# Patient Record
Sex: Male | Born: 1990 | Race: White | Hispanic: No | Marital: Married | Smoking: Never smoker
Health system: Southern US, Community
[De-identification: ages and names within clinical notes are randomized; demographics above are authoritative.]

## PROBLEM LIST (undated history)

## (undated) HISTORY — PX: CLAVICLE SURGERY: SHX598

---

## 2016-08-14 ENCOUNTER — Emergency Department (HOSPITAL_BASED_OUTPATIENT_CLINIC_OR_DEPARTMENT_OTHER): Payer: BLUE CROSS/BLUE SHIELD

## 2016-08-14 ENCOUNTER — Emergency Department (HOSPITAL_BASED_OUTPATIENT_CLINIC_OR_DEPARTMENT_OTHER)
Admission: EM | Admit: 2016-08-14 | Discharge: 2016-08-14 | Disposition: A | Discharge status: Home / Self Care | Payer: BLUE CROSS/BLUE SHIELD | Attending: Emergency Medicine | Admitting: Emergency Medicine

## 2016-08-14 ENCOUNTER — Encounter (HOSPITAL_BASED_OUTPATIENT_CLINIC_OR_DEPARTMENT_OTHER): Payer: Self-pay

## 2016-08-14 DIAGNOSIS — Y999 Unspecified external cause status: Secondary | ICD-10-CM | POA: Insufficient documentation

## 2016-08-14 DIAGNOSIS — Y929 Unspecified place or not applicable: Secondary | ICD-10-CM | POA: Insufficient documentation

## 2016-08-14 DIAGNOSIS — M25462 Effusion, left knee: Secondary | ICD-10-CM

## 2016-08-14 DIAGNOSIS — S83512A Sprain of anterior cruciate ligament of left knee, initial encounter: Secondary | ICD-10-CM

## 2016-08-14 DIAGNOSIS — T148XXA Other injury of unspecified body region, initial encounter: Secondary | ICD-10-CM

## 2016-08-14 DIAGNOSIS — W2107XA Struck by softball, initial encounter: Secondary | ICD-10-CM | POA: Diagnosis not present

## 2016-08-14 DIAGNOSIS — Y9364 Activity, baseball: Secondary | ICD-10-CM | POA: Diagnosis not present

## 2016-08-14 DIAGNOSIS — S82202A Unspecified fracture of shaft of left tibia, initial encounter for closed fracture: Secondary | ICD-10-CM | POA: Insufficient documentation

## 2016-08-14 DIAGNOSIS — S8992XA Unspecified injury of left lower leg, initial encounter: Secondary | ICD-10-CM | POA: Diagnosis present

## 2016-08-14 MED ORDER — IBUPROFEN 800 MG PO TABS: 800.0000 mg | ORAL_TABLET | Freq: Four times a day (QID) | ORAL | 0 refills | Status: AC

## 2016-08-14 NOTE — ED Triage Notes (Signed)
c/o left knee injury while running approx 1 hour PTA-presents to triage in w/c

## 2016-08-14 NOTE — ED Provider Notes (Signed)
[facility] Provider Note   CSN: [id number] Arrival date & time: 08/14/16  2052  By signing my name below, I, Nelwyn Salisbury, attest that this documentation has been prepared under the direction and in the presence of Lyndal Pulley, MD. Electronically Signed: Nelwyn Salisbury, Scribe. 08/14/2016. 10:50 PM.  History   Chief Complaint Chief Complaint  Patient presents with  . Knee Injury   The history is provided by the patient. No language interpreter was used.   HPI Comments:  Eric Leon is an otherwise healthy 26 y.o. male who presents to the Emergency Department complaining of sudden-onset, constant, moderate left knee pain onset today. Pt was playing softball when he slid into a base and felt sudden pain. He reports associated weakness to the area. Pt has not tried any medications PTA. Denies any numbness.   History reviewed. No pertinent past medical history.  There are no active problems to display for this patient.   Past Surgical History:  Procedure Laterality Date  . CLAVICLE SURGERY      Home Medications    Prior to Admission medications   Not on File    Family History No family history on file.  Social History Social History  Substance Use Topics  . Smoking status: Never Smoker  . Smokeless tobacco: Never Used  . Alcohol use No     Allergies   Penicillins   Review of Systems Review of Systems  Musculoskeletal: Positive for arthralgias.  Neurological: Positive for weakness. Negative for numbness.  All other systems reviewed and are negative.    Physical Exam Updated Vital Signs BP 131/81 (BP Location: Left Arm)   Pulse 84   Temp 98.2 F (36.8 C) (Oral)   Resp 20   Ht  (1.88 m)   Wt 190 lb (86.2 kg)   SpO2 99%   BMI 24.39 kg/m   Physical Exam  Constitutional: He is oriented to person, place, and time. He appears well-developed and well-nourished. No distress.  HENT:  Head: Normocephalic and atraumatic.  Eyes: Conjunctivae  are normal.  Cardiovascular: Normal rate.   Pulmonary/Chest: Effort normal.  Abdominal: He exhibits no distension.  Musculoskeletal:       Left knee: He exhibits effusion (small). He exhibits normal range of motion. No tenderness found.  Negative anterior/posterior drawer. Negative Lachman.   Neurological: He is alert and oriented to person, place, and time.  Skin: Skin is warm and dry.  Psychiatric: He has a normal mood and affect.  Nursing note and vitals reviewed.  ED Treatments / Results  DIAGNOSTIC STUDIES:  Oxygen Saturation is 99% on RA, normal by my interpretation.    COORDINATION OF CARE:  10:58 PM Discussed treatment plan with pt at bedside which includes rest ice, compression and elevation. Pt will be referred to orthopedics for follow-up. Pt agreed to plan.  Labs (all labs ordered are listed, but only abnormal results are displayed) Labs Reviewed - No data to display  EKG  EKG Interpretation None       Radiology Dg Knee Complete 4 Views Left  Result Date: 08/14/2016 CLINICAL DATA:  Pain following fall EXAM: LEFT KNEE - COMPLETE 4+ VIEW COMPARISON:  None. FINDINGS: Frontal, lateral, and bilateral oblique views were obtained. There are avulsion fractures along the anterior tibial surface, appreciable only on the lateral view. No other fractures are evident. No dislocation. There is a sizable joint effusion. There is no appreciable joint space narrowing. No erosive change peer IMPRESSION: Small avulsion fractures along the  anterior tibial surface seen only on the lateral view. Sizable joint effusion. No other fractures. No dislocations. No appreciable arthropathic change. Electronically Signed   By: Bretta Bang III M.D.   On: 08/14/2016 21:45    Procedures Procedures (including critical care time)  Medications Ordered in ED Medications - No data to display   Initial Impression / Assessment and Plan / ED Course  I have reviewed the triage vital signs and the  nursing notes.  Pertinent labs & imaging results that were available during my care of the patient were reviewed by me and considered in my medical decision making (see chart for details).     26 y.o. male presents with immediate knee pain after playing softball. He has a small joint effusion and small avulsion fracture at ACL insertion suspicious for ACL sprain and partial tear. Stable to AP confrontation. Knee immobilizer, crutches, and f/u with orthopedics for evaluation and PT versus MR for definitive imaging.   Final Clinical Impressions(s) / ED Diagnoses   Final diagnoses:  Sprain of anterior cruciate ligament of left knee, initial encounter  Effusion of left knee  Avulsion fracture    New Prescriptions Discharge Medication List as of 08/14/2016 11:10 PM    START taking these medications   Details  ibuprofen (ADVIL,MOTRIN) 800 MG tablet Take 1 tablet (800 mg total) by mouth every 6 (six) hours., Starting Tue 08/14/2016, Until Sun 08/19/2016, Print      I personally performed the services described in this documentation, which was scribed in my presence. The recorded information has been reviewed and is accurate.       Lyndal Pulley, MD 08/15/16 [phone]

## 2016-08-17 ENCOUNTER — Other Ambulatory Visit (HOSPITAL_COMMUNITY): Payer: Self-pay | Admitting: Orthopedic Surgery

## 2016-08-17 ENCOUNTER — Other Ambulatory Visit: Payer: Self-pay | Admitting: Orthopedic Surgery

## 2016-08-17 ENCOUNTER — Ambulatory Visit (HOSPITAL_COMMUNITY)
Admission: RE | Admit: 2016-08-17 | Discharge: 2016-08-17 | Disposition: A | Discharge status: Home / Self Care | Payer: BLUE CROSS/BLUE SHIELD | Source: Ambulatory Visit | Attending: Cardiology | Admitting: Cardiology

## 2016-08-17 DIAGNOSIS — M79662 Pain in left lower leg: Secondary | ICD-10-CM | POA: Diagnosis not present

## 2016-08-17 DIAGNOSIS — M79605 Pain in left leg: Secondary | ICD-10-CM | POA: Diagnosis not present

## 2016-08-17 DIAGNOSIS — M7989 Other specified soft tissue disorders: Secondary | ICD-10-CM | POA: Diagnosis not present

## 2016-08-17 DIAGNOSIS — M2392 Unspecified internal derangement of left knee: Secondary | ICD-10-CM

## 2016-08-18 ENCOUNTER — Ambulatory Visit
Admission: RE | Admit: 2016-08-18 | Discharge: 2016-08-18 | Disposition: A | Discharge status: Home / Self Care | Payer: BLUE CROSS/BLUE SHIELD | Source: Ambulatory Visit | Attending: Orthopedic Surgery | Admitting: Orthopedic Surgery

## 2016-08-18 DIAGNOSIS — M2392 Unspecified internal derangement of left knee: Secondary | ICD-10-CM

## 2016-09-05 ENCOUNTER — Ambulatory Visit: Payer: BLUE CROSS/BLUE SHIELD | Attending: Orthopedic Surgery | Admitting: Physical Therapy

## 2016-09-05 DIAGNOSIS — R2689 Other abnormalities of gait and mobility: Secondary | ICD-10-CM

## 2016-09-05 DIAGNOSIS — M25562 Pain in left knee: Secondary | ICD-10-CM

## 2016-09-05 DIAGNOSIS — M25662 Stiffness of left knee, not elsewhere classified: Secondary | ICD-10-CM

## 2016-09-05 DIAGNOSIS — M6281 Muscle weakness (generalized): Secondary | ICD-10-CM | POA: Diagnosis present

## 2016-09-05 DIAGNOSIS — R262 Difficulty in walking, not elsewhere classified: Secondary | ICD-10-CM | POA: Diagnosis present

## 2016-09-05 NOTE — Therapy (Signed)
[facility] [address]  [address] [place], [place], [address] Phone: [phone]   Fax:  [phone]  Physical Therapy Evaluation  Patient Details  Name: Eric Leon MRN: [id number] Date of Birth: 31-Jan-1991 Referring Provider: Dr. Duwayne Leon  Encounter Date: 09/05/2016      PT End of Session - 09/05/16 0906    Visit Number 1   Number of Visits 12   Date for PT Re-Evaluation 10/17/16   PT Start Time 0840   PT Stop Time 0916   PT Time Calculation (min) 36 min   Activity Tolerance Patient tolerated treatment well   Behavior During Therapy [facility] for tasks assessed/performed      No past medical history on file.  Past Surgical History:  Procedure Laterality Date  . CLAVICLE SURGERY      There were no vitals filed for this visit.       Subjective Assessment - 09/05/16 0842    Subjective Pateint reports he was playing softball and hyperextended knee - tear of L medial meniscus. Dr. Aundria Rud put patient in brace as well as bilateral axillary crutches. Reports he can be WBAT - does not know if surgery is in the future or not. Only pain with bending and flexing of knee.    Limitations Standing;Walking   Diagnostic tests MRI - tear of L medial meniscus, torn PCL   Patient Stated Goals to not have surgery   Currently in Pain? No/denies            [facility] PT Assessment - 09/05/16 0844      Assessment   Medical Diagnosis tear of L medial meniscus, torn L PCL   Referring Provider Dr. Duwayne Leon   Onset Date/Surgical Date 08/14/16   Next MD Visit 09/24/16   Prior Therapy no     Precautions   Precautions None     Restrictions   Weight Bearing Restrictions Yes   LLE Weight Bearing Weight bearing as tolerated   Other Position/Activity Restrictions bilateral axillary crutches     Balance Screen   Has the patient fallen in the past 6 months No   Has the patient had a decrease in activity level because of a fear of  falling?  No   Is the patient reluctant to leave their home because of a fear of falling?  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   Leisure softball, basketball     Cognition   Overall Cognitive Status Within Functional Limits for tasks assessed     Observation/Other Assessments   Focus on Therapeutic Outcomes (FOTO)  Knee: 28 (72% limited, predicted 39% limited)     Sensation   Light Touch Appears Intact     Coordination   Gross Motor Movements are Fluid and Coordinated Yes     ROM / Strength   AROM / PROM / Strength AROM;PROM;Strength     AROM   AROM Assessment Site Knee   Right/Left Knee Right;Left   Right Knee Extension -3   Right Knee Flexion 138  Left Knee Extension 10   Left Knee Flexion 106     Strength   Strength Assessment Site Hip;Knee   Right/Left Hip Right;Left   Right Hip Flexion 4+/5   Left Hip Flexion 3+/5   Right/Left Knee Right;Left   Right Knee Flexion 5/5   Right Knee Extension 5/5   Left Knee Flexion 4-/5   Left Knee Extension 4-/5     Ambulation/Gait   Ambulation/Gait Yes   Ambulation/Gait Assistance 6: Modified independent (Device/Increase time);5: Supervision   Ambulation Distance (Feet) 200 Feet   Assistive device R Axillary Crutch;L Axillary Crutch   Gait Pattern Step-to pattern   Ambulation Surface Level;Indoor   Gait Comments education on step through pattern with beginning to place weight through L LE - a this time a tendency to place L toes on ground only - patient reporting "tight" calf mm.                    [facility] Adult PT Treatment/Exercise - 09/05/16  0844      Exercises   Exercises Knee/Hip     Knee/Hip Exercises: Stretches   Passive Hamstring Stretch Left;3 reps;30 seconds   Passive Hamstring Stretch Limitations supine with strap   Gastroc Stretch Left;3 reps;30 seconds   Gastroc Stretch Limitations long-sitting with strap     Knee/Hip Exercises: Supine   Quad Sets Strengthening;Left;15 reps   Quad Sets Limitations with foot propped   Straight Leg Raises Strengthening;Left;15 reps                PT Education - 09/05/16 0906    Education provided Yes   Education Details exam findings, POC, HEP   Person(s) Educated Patient   Methods Explanation;Demonstration;Handout   Comprehension Verbalized understanding;Returned demonstration          PT Short Term Goals - 09/05/16 1055      PT SHORT TERM GOAL #1   Title Patient to be independent with initial HEP for gentle stretching and strengthening (09/19/16)   Status New           PT Long Term Goals - 09/05/16 1154      PT LONG TERM GOAL #1   Title Patient to be independent with advanced HEP (10/17/16)   Status New     PT LONG TERM GOAL #2   Title Patient to demonstrate proper gait mechanics with good heel toe gait pattern (10/17/16)   Status New     PT LONG TERM GOAL #3   Title Patient to improve L knee AROM equal to that of R knee with no pain (10/17/16)   Status New     PT LONG TERM GOAL #4   Title Patient to improve L LE gross strength to >/=4+/5 (10/17/16)   Status New     PT LONG TERM GOAL #5   Title Patient to demonstrate reciprocal gait pattern up/down 1 flight of stairs with good quad control (10/17/16)   Status New               Plan - 09/05/16 0909    Clinical Impression Statement Eric Leon is a 26 y/o male presenting to OPPT today for initial evaluation following L medial meniscus and L PCL tear along with MCL sprain and MCL and pes anserine traumatic bursitis due to hyperextension injury sustained during softball. Patient presenting to PT today  with bilateral axillary crutches as well as brace to prevent buckling of L knee during gait. Patient ambulating with step to pattern  with no weight bearing through LE - education today on step through pattern with beginning to place weight through toes on L LE. AROM as well as strength limited with knee extension being current main limiting factor - however improved with quad sets and static stretching. Patient to benefit from PT to address L knee ROM and strength, as well as to promote return to normal gait mechanics.    Rehab Potential Good   PT Frequency 2x / week   PT Duration 6 weeks   PT Treatment/Interventions ADLs/Self Care Home Management;Cryotherapy;Electrical Stimulation;Moist Heat;Ultrasound;Neuromuscular re-education;Balance training;Therapeutic exercise;Therapeutic activities;Functional mobility training;Stair training;Gait training;Patient/family education;Manual techniques;Passive range of motion;Vasopneumatic Device;Taping;Dry needling;Iontophoresis /ml Dexamethasone   PT Next Visit Plan gentle stretching and strengthening   Consulted and Agree with Plan of Care Patient      Patient will benefit from skilled therapeutic intervention in order to improve the following deficits and impairments:  Abnormal gait, Decreased activity tolerance, Decreased balance, Decreased range of motion, Decreased mobility, Decreased strength, Difficulty walking, Increased edema, Pain  Visit Diagnosis: Acute pain of left knee - Plan: PT plan of care cert/re-cert  Stiffness of left knee, not elsewhere classified - Plan: PT plan of care cert/re-cert  Difficulty in walking, not elsewhere classified - Plan: PT plan of care cert/re-cert  Other abnormalities of gait and mobility - Plan: PT plan of care cert/re-cert  Muscle weakness (generalized) - Plan: PT plan of care cert/re-cert     Problem List There are no active problems to display for this patient.    [name], PT, DPT 09/05/16  12:02 PM   [facility] [address]  [address] [place], [place], [address] Phone: [phone]   Fax:  [phone]  Name: Eric Leon MRN: [id number] Date of Birth: 12-31-1990

## 2016-09-05 NOTE — Patient Instructions (Signed)
Quad Set    With other leg bent, foot flat, slowly tighten muscles on thigh of straight leg while counting out loud to __5-10__.  Repeat _15___ times. Do __2-3__ sessions per day.   Straight Leg Raise    Bend one leg. Raise other leg _8 or so___ inches with knee locked. Exhale and tighten thigh muscles while raising leg. Repeat with other leg. Repeat __15__ times. Do __2-3__ sessions per day.   Hamstring Step 2    Left foot relaxed, knee straight, other leg bent, foot flat. Raise straight leg further upward to maximal range. Hold _30__ seconds. Relax leg completely down. Repeat _3__ times.   Calf Stretch    With towel around forefoot, keep knee straight and pull back on towel until a stretch is felt in the calf. Hold __30__ seconds. Repeat _3___ times. Do _2-3___ sessions per day.

## 2016-09-10 ENCOUNTER — Ambulatory Visit: Payer: BLUE CROSS/BLUE SHIELD | Admitting: Physical Therapy

## 2016-09-10 DIAGNOSIS — M25662 Stiffness of left knee, not elsewhere classified: Secondary | ICD-10-CM

## 2016-09-10 DIAGNOSIS — R2689 Other abnormalities of gait and mobility: Secondary | ICD-10-CM

## 2016-09-10 DIAGNOSIS — M25562 Pain in left knee: Secondary | ICD-10-CM

## 2016-09-10 DIAGNOSIS — M6281 Muscle weakness (generalized): Secondary | ICD-10-CM

## 2016-09-10 DIAGNOSIS — R262 Difficulty in walking, not elsewhere classified: Secondary | ICD-10-CM

## 2016-09-10 NOTE — Patient Instructions (Signed)
Short Arc [employer] a large can or rolled towel under leg. Straighten knee and leg. Hold _5-10_ seconds. Repeat with other leg. Repeat __2__ times.    Bridging   Slowly raise buttocks from floor, keeping stomach tight. Repeat __15__ times per set. Do __2__ sets per session.    Straight Leg Raise   Tighten stomach and slowly raise locked right leg __~8__ inches from floor. Repeat __15__ times per set. Do __2__ sets per session.    KNEE: Extension, Long Arc Quads - Sitting    Raise leg until knee is straight. _15__ reps per set, __2_ sets per day   ANKLE: Dorsiflexion, Step Unilateral    Stand on step, hang one heel off back of step. Hold __30-60_ seconds. _15__ reps per set, __2_ sets per day. Hold onto support

## 2016-09-10 NOTE — Therapy (Signed)
[facility] [address]  [address] [place], [place], [address] Phone: [phone]   Fax:  [phone]  Physical Therapy Treatment  Patient Details  Name: Eric Leon MRN: [id number] Date of Birth: [birth year] Referring Provider: Dr. Duwayne Heck  Encounter Date: 09/10/2016      PT End of Session - 09/10/16 1652    Visit Number 2   Number of Visits 12   Date for PT Re-Evaluation 10/17/16   PT Start Time 1650   PT Stop Time 1743   PT Time Calculation (min) 53 min   Activity Tolerance Patient tolerated treatment well   Behavior During Therapy [facility] for tasks assessed/performed      No past medical history on file.  Past Surgical History:  Procedure Laterality Date  . CLAVICLE SURGERY      There were no vitals filed for this visit.      Subjective Assessment - 09/10/16 1652    Subjective Doing well - continues to hop with crutches   Diagnostic tests MRI - tear of L medial meniscus, torn PCL   Patient Stated Goals to not have surgery   Currently in Pain? No/denies   Multiple Pain Sites No                         [facility] Adult PT Treatment/Exercise - 09/10/16 1653      Ambulation/Gait   Ambulation/Gait Yes   Ambulation/Gait Assistance 6: Modified independent (Device/Increase time)   Ambulation Distance (Feet) 150 Feet   Assistive device R Axillary Crutch   Gait Pattern Step-through pattern;Decreased stance time - left;Decreased step length - right;Decreased dorsiflexion - left;Decreased hip/knee flexion - left;Decreased weight shift to left;Antalgic   Ambulation Surface Level;Indoor     Exercises   Exercises Knee/Hip     Knee/Hip Exercises: Stretches   Gastroc Stretch Left;3 reps;30 seconds   Gastroc Stretch Limitations blue rocker     Knee/Hip Exercises: Aerobic   Recumbent Bike L2 x 6 minutes     Knee/Hip Exercises: Seated   Long Arc Quad Left;20 reps   Long Arc Quad Limitations 5 sec hold    Hamstring Curl Left;20 reps   Hamstring Limitations red tband   Sit to [employer] 20 reps;without UE support  emphasis on equal weight shift and control     Knee/Hip Exercises: Supine   Quad Sets Left;20 reps   [employer] Limitations 5 sec hold   Short Arc [employer] Left;20 reps   Short Arc Quad Sets Limitations 5 sec hold   Bridges Both;15 reps   Straight Leg Raises Left;20 reps   Other Supine Knee/Hip Exercises straight leg bridge on peanut ball x 15 reps - 5 sec hold                  PT Short Term Goals - 09/05/16 1055      PT SHORT TERM GOAL #1   Title Patient to be independent with initial HEP for gentle stretching and strengthening (09/19/16)   Status New           PT Long Term Goals - 09/05/16 1154      PT LONG TERM GOAL #1   Title Patient to be independent with advanced HEP (10/17/16)   Status New     PT LONG TERM GOAL #2   Title Patient to demonstrate proper gait mechanics with good heel toe gait pattern (10/17/16)   Status New  PT LONG TERM GOAL #3   Title Patient to improve L knee AROM equal to that of R knee with no pain (10/17/16)   Status New     PT LONG TERM GOAL #4   Title Patient to improve L LE gross strength to >/=4+/5 (10/17/16)   Status New     PT LONG TERM GOAL #5   Title Patient to demonstrate reciprocal gait pattern up/down 1 flight of stairs with good quad control (10/17/16)   Status New               Plan - 09/10/16 1653    Clinical Impression Statement PT session today focusing heavily on quad strengthening as well as return to normal gait mechanics. Patient educated to continue to wear locked brace as well as weaning to unilateral crutch with education given on gait mechanics with single crutch with good carryover. Patient doing well with initial strengthening program, however, great deficits seen in quad strength and muscle activation. WIll continue to focus on strenghtening and gait mechaincs at next visit.    PT  Treatment/Interventions ADLs/Self Care Home Management;Cryotherapy;Electrical Stimulation;Moist Heat;Ultrasound;Neuromuscular re-education;Balance training;Therapeutic exercise;Therapeutic activities;Functional mobility training;Stair training;Gait training;Patient/family education;Manual techniques;Passive range of motion;Vasopneumatic Device;Taping;Dry needling;Iontophoresis /ml Dexamethasone   PT Next Visit Plan gentle stretching and strengthening   Consulted and Agree with Plan of Care Patient      Patient will benefit from skilled therapeutic intervention in order to improve the following deficits and impairments:  Abnormal gait, Decreased activity tolerance, Decreased balance, Decreased range of motion, Decreased mobility, Decreased strength, Difficulty walking, Increased edema, Pain  Visit Diagnosis: Acute pain of left knee  Stiffness of left knee, not elsewhere classified  Difficulty in walking, not elsewhere classified  Other abnormalities of gait and mobility  Muscle weakness (generalized)     Problem List There are no active problems to display for this patient.    [name], PT, DPT 09/10/16 5:49 PM   [facility] [address]  [address] [place], [place], [address] Phone: [phone]   Fax:  [phone]  Name: Deddrick Saindon MRN: [id number] Date of Birth: 1991-01-20

## 2016-09-13 ENCOUNTER — Encounter (INDEPENDENT_AMBULATORY_CARE_PROVIDER_SITE_OTHER): Payer: Self-pay

## 2016-09-13 ENCOUNTER — Ambulatory Visit: Payer: BLUE CROSS/BLUE SHIELD | Attending: Orthopedic Surgery

## 2016-09-13 DIAGNOSIS — M6281 Muscle weakness (generalized): Secondary | ICD-10-CM | POA: Diagnosis present

## 2016-09-13 DIAGNOSIS — M25662 Stiffness of left knee, not elsewhere classified: Secondary | ICD-10-CM

## 2016-09-13 DIAGNOSIS — R2689 Other abnormalities of gait and mobility: Secondary | ICD-10-CM | POA: Diagnosis present

## 2016-09-13 DIAGNOSIS — M25562 Pain in left knee: Secondary | ICD-10-CM | POA: Insufficient documentation

## 2016-09-13 DIAGNOSIS — R262 Difficulty in walking, not elsewhere classified: Secondary | ICD-10-CM | POA: Insufficient documentation

## 2016-09-13 NOTE — Therapy (Signed)
[facility] [address]  [address] [place], [place], [address] Phone: [phone]   Fax:  [phone]  Physical Therapy Treatment  Patient Details  Name: Eric Leon MRN: [id number] Date of Birth: 05-23-1990 Referring Provider: Dr. Duwayne Heck  Encounter Date: 09/13/2016      PT End of Session - 09/13/16 0803    Visit Number 3   Number of Visits 12   Date for PT Re-Evaluation 10/17/16   PT Start Time 0801   PT Stop Time 0846   PT Time Calculation (min) 45 min   Activity Tolerance Patient tolerated treatment well   Behavior During Therapy [facility] for tasks assessed/performed      No past medical history on file.  Past Surgical History:  Procedure Laterality Date  . CLAVICLE SURGERY      There were no vitals filed for this visit.      Subjective Assessment - 09/13/16 0803    Subjective Pt. seen walking without crutches today and has been trying this at home over the last few   Patient Stated Goals to not have surgery   Currently in Pain? No/denies  3/10 ache when walking   Multiple Pain Sites No                         [facility] Adult PT Treatment/Exercise - 09/13/16 0811      Ambulation/Gait   Ambulation/Gait Yes   Ambulation/Gait Assistance 6: Modified independent (Device/Increase time)   Ambulation Distance (Feet) 800 Feet   Assistive device R Axillary Crutch;None   Gait Pattern Step-through pattern;Decreased stance time - left;Decreased step length - right;Decreased dorsiflexion - left;Decreased hip/knee flexion - left;Decreased weight shift to left;Antalgic   Ambulation Surface Level;Indoor   Gait Comments 400 ft with R axillary crutch and 400 ft without AD; working on increased step length with axillary crutch and working on even wt. shift without AD      Knee/Hip Exercises: Community education officer Left;30 seconds;2 reps   Passive Hamstring Stretch Limitations supine with strap    Gastroc Stretch Left;30 seconds;2 reps     Knee/Hip Exercises: Aerobic   Recumbent Bike L2 x 6 minutes     Knee/Hip Exercises: Standing   Other Standing Knee Exercises staggered stance weight shift L foot forward with forward/back weight shift with red TB pulling     Knee/Hip Exercises: Seated   Hamstring Curl Left;20 reps   Hamstring Limitations red tband     Knee/Hip Exercises: Supine   Quad Sets Left;20 reps   Quad Sets Limitations 5" hold    Straight Leg Raises Left;20 reps;3 sets   Straight Leg Raises Limitations heel on bolster with quad set prior to each movements      Knee/Hip Exercises: Sidelying   Hip ABduction AROM;Left;1 set;15 reps   Hip ABduction Limitations 3" hold    Hip ADduction AROM;Left   Hip ADduction Limitations 3" hold                 PT Education - 09/13/16 1006    Education provided Yes   Education Details standing calf stretch    Person(s) Educated Patient   Methods Explanation;Demonstration;Handout   Comprehension Verbalized understanding;Returned demonstration;Need further instruction          PT Short Term Goals - 09/13/16 0805      PT SHORT TERM GOAL #1   Title Patient to be independent with initial HEP  for gentle stretching and strengthening (09/19/16)   Status On-going           PT Long Term Goals - 09/13/16 0805      PT LONG TERM GOAL #1   Title Patient to be independent with advanced HEP (10/17/16)   Status On-going     PT LONG TERM GOAL #2   Title Patient to demonstrate proper gait mechanics with good heel toe gait pattern (10/17/16)   Status On-going     PT LONG TERM GOAL #3   Title Patient to improve L knee AROM equal to that of R knee with no pain (10/17/16)   Status On-going     PT LONG TERM GOAL #4   Title Patient to improve L LE gross strength to >/=4+/5 (10/17/16)   Status On-going     PT LONG TERM GOAL #5   Title Patient to demonstrate reciprocal gait pattern up/down 1 flight of stairs with good quad  control (10/17/16)   Status On-going               Plan - 09/13/16 0806    Clinical Impression Statement Pt. seen today walking without locked knee brace and crutch.  Pt. reporting he has been trying to walk without knee brace and crutch at home.  Pt. pain free with this however still with visible quad instability and antalgic gait without.  Instructed to continue to use brace and crutch until further notice.  Treatment focusing on quad strengthening and gait mechanics.  Pt. tolerated all activities well today and verbalized plan to continue wearing knee brace.  Gait training with and without crutch today working on even step length, heel strike and stance time.  Pt. able to self-correct with min cueing.  HEP updated to include standing calf stretch.     PT Treatment/Interventions ADLs/Self Care Home Management;Cryotherapy;Electrical Stimulation;Moist Heat;Ultrasound;Neuromuscular re-education;Balance training;Therapeutic exercise;Therapeutic activities;Functional mobility training;Stair training;Gait training;Patient/family education;Manual techniques;Passive range of motion;Vasopneumatic Device;Taping;Dry needling;Iontophoresis 4mg /ml Dexamethasone   PT Next Visit Plan Gentle stretching and strengthening      Patient will benefit from skilled therapeutic intervention in order to improve the following deficits and impairments:  Abnormal gait, Decreased activity tolerance, Decreased balance, Decreased range of motion, Decreased mobility, Decreased strength, Difficulty walking, Increased edema, Pain  Visit Diagnosis: Acute pain of left knee  Stiffness of left knee, not elsewhere classified  Difficulty in walking, not elsewhere classified  Other abnormalities of gait and mobility  Muscle weakness (generalized)     Problem List There are no active problems to display for this patient.   Kermit BaloMicah Tynia Wiers, PTA 09/13/16 10:10 AM  [facility] [address]  [address] [place], [place], [address] Phone: [phone]   Fax:  [phone]  Name: Eric Leon MRN: [id number] Date of Birth: 1990/08/02

## 2016-09-17 ENCOUNTER — Ambulatory Visit: Payer: BLUE CROSS/BLUE SHIELD | Admitting: Physical Therapy

## 2016-09-17 DIAGNOSIS — R262 Difficulty in walking, not elsewhere classified: Secondary | ICD-10-CM

## 2016-09-17 DIAGNOSIS — M25662 Stiffness of left knee, not elsewhere classified: Secondary | ICD-10-CM

## 2016-09-17 DIAGNOSIS — M6281 Muscle weakness (generalized): Secondary | ICD-10-CM

## 2016-09-17 DIAGNOSIS — M25562 Pain in left knee: Secondary | ICD-10-CM

## 2016-09-17 DIAGNOSIS — R2689 Other abnormalities of gait and mobility: Secondary | ICD-10-CM

## 2016-09-17 NOTE — Therapy (Signed)
[facility] [address]  [address] [place], [place], [address] Phone: [phone]   Fax:  [phone]  Physical Therapy Treatment  Patient Details  Name: Eric AdeKevin Mcphie MRN: [id number] Date of Birth: [birth year] Referring Provider: Dr. Duwayne HeckJason Rogers  Encounter Date: 09/17/2016      PT End of Session - 09/17/16 0755    Visit Number 4   Number of Visits 12   Date for PT Re-Evaluation 10/17/16   PT Start Time 0753   PT Stop Time 0834   PT Time Calculation (min) 41 min   Activity Tolerance Patient tolerated treatment well   Behavior During Therapy [facility] for tasks assessed/performed      No past medical history on file.  Past Surgical History:  Procedure Laterality Date  . CLAVICLE SURGERY      There were no vitals filed for this visit.      Subjective Assessment - 09/17/16 0754    Subjective Doing well today - wlaking wihtout brace or crutch; no pain with walking   Diagnostic tests MRI - tear of L medial meniscus, torn PCL   Patient Stated Goals to not have surgery   Currently in Pain? No/denies                         [facility] Treatment/Exercise - 09/17/16 0756      Knee/Hip Exercises: Stretches   Gastroc Stretch Left;3 reps;30 seconds   Gastroc Stretch Limitations blue rocker     Knee/Hip Exercises: Aerobic   Recumbent Bike L4 x 6 minutes     Knee/Hip Exercises: Standing   Heel Raises Limitations 10 reps B LE; 15 repr L LE eccentric   Terminal Knee Extension Limitations 2 x 15 - blue tband    Step Down Left;2 sets   Step Down Limitations 1 set with 4" step; 1 set with 6" step   Functional Squat 20 reps   SLS 3 x 30 sec - foam with no perturbations; L SL on foam catching/tossing basketball x 2 min     Knee/Hip Exercises: Seated   Long Arc Quad Left;20 reps   Long Arc Quad Weight 3 lbs.   Long Arc Quad Limitations with adduction ball squeeze   Hamstring Curl Left;20 reps   Hamstring  Limitations green tband     Knee/Hip Exercises: Supine   Straight Leg Raises Strengthening;Left;2 sets;15 reps   Straight Leg Raises Limitations 3#                  PT Short Term Goals - 09/13/16 0805      PT SHORT TERM GOAL #1   Title Patient to be independent with initial HEP for gentle stretching and strengthening (09/19/16)   Status On-going           PT Long Term Goals - 09/13/16 0805      PT LONG TERM GOAL #1   Title Patient to be independent with advanced HEP (10/17/16)   Status On-going     PT LONG TERM GOAL #2   Title Patient to demonstrate proper gait mechanics with good heel toe gait pattern (10/17/16)   Status On-going     PT LONG TERM GOAL #3   Title Patient to improve L knee AROM equal to that of R knee with no pain (10/17/16)   Status On-going     PT LONG TERM GOAL #4   Title Patient to improve L  LE gross strength to >/=4+/5 (10/17/16)   Status On-going     PT LONG TERM GOAL #5   Title Patient to demonstrate reciprocal gait pattern up/down 1 flight of stairs with good quad control (10/17/16)   Status On-going               Plan - 09/17/16 0755    Clinical Impression Statement Good progression of standing strength task today with no pain - only reports of tightness in posterior lower leg, even after stretching. Much improved gait pattern without use of brace or crutch - only slight deviation related to L gastroc/soleus tightness. No quad lag today with resisted SLR.    PT Treatment/Interventions ADLs/Self Care Home Management;Cryotherapy;Electrical Stimulation;Moist Heat;Ultrasound;Neuromuscular re-education;Balance training;Therapeutic exercise;Therapeutic activities;Functional mobility training;Stair training;Gait training;Patient/family education;Manual techniques;Passive range of motion;Vasopneumatic Device;Taping;Dry needling;Iontophoresis 4mg /ml Dexamethasone   PT Next Visit Plan Gentle stretching and strengthening   Consulted and Agree with  Plan of Care Patient      Patient will benefit from skilled therapeutic intervention in order to improve the following deficits and impairments:  Abnormal gait, Decreased activity tolerance, Decreased balance, Decreased range of motion, Decreased mobility, Decreased strength, Difficulty walking, Increased edema, Pain  Visit Diagnosis: Acute pain of left knee  Stiffness of left knee, not elsewhere classified  Difficulty in walking, not elsewhere classified  Other abnormalities of gait and mobility  Muscle weakness (generalized)     Problem List There are no active problems to display for this patient.    [name], PT, DPT 09/17/16 8:36 AM   [facility] [address]  [address] [place], [place], [address] Phone: [phone]   Fax:  [phone]  Name: Makhai Fulco MRN: [id number] Date of Birth: [birth year]

## 2016-09-20 ENCOUNTER — Ambulatory Visit: Payer: BLUE CROSS/BLUE SHIELD

## 2016-09-20 DIAGNOSIS — M6281 Muscle weakness (generalized): Secondary | ICD-10-CM

## 2016-09-20 DIAGNOSIS — M25662 Stiffness of left knee, not elsewhere classified: Secondary | ICD-10-CM

## 2016-09-20 DIAGNOSIS — R262 Difficulty in walking, not elsewhere classified: Secondary | ICD-10-CM

## 2016-09-20 DIAGNOSIS — M25562 Pain in left knee: Secondary | ICD-10-CM

## 2016-09-20 DIAGNOSIS — R2689 Other abnormalities of gait and mobility: Secondary | ICD-10-CM

## 2016-09-20 NOTE — Therapy (Signed)
[facility] [place] [facility] [address]  [place], [place], [address] Phone: [phone]   Fax:  [phone]  Physical Therapy Treatment  Patient Details  Name: Eric Leon MRN: [id number] Date of Birth: 1990-05-18 Referring Provider: Dr. Victorino December  Encounter Date: 09/20/2016      PT End of Session - 09/20/16 0802    Visit Number 5   Number of Visits 12   Date for PT Re-Evaluation 10/17/16   PT Start Time 0800   PT Stop Time 0845   PT Time Calculation (min) 45 min   Activity Tolerance Patient tolerated treatment well   Behavior During Therapy [facility] for tasks assessed/performed      No past medical history on file.  Past Surgical History:  Procedure Laterality Date  . CLAVICLE SURGERY      There were no vitals filed for this visit.      Subjective Assessment - 09/20/16 0802    Subjective Pt. doing well without complaint.  Has not been wearing brace.     Patient Stated Goals to not have surgery   Currently in Pain? No/denies   Multiple Pain Sites No            [facility] PT Assessment - 09/20/16 0807      AROM   AROM Assessment Site Knee   Right/Left Knee Right;Left   Right Knee Extension -3   Right Knee Flexion 138   Left Knee Extension 0   Left Knee Flexion 117     Strength   Strength Assessment Site Hip;Knee   Right/Left Hip Right;Left   Right Hip Flexion 4+/5   Left Hip Flexion 4+/5   Right/Left Knee Right;Left   Right Knee Flexion 5/5   Right Knee Extension 5/5   Left Knee Flexion 4/5   Left Knee Extension 4/5                     [facility] Adult PT Treatment/Exercise - 09/20/16 2694      Ambulation/Gait   Ambulation/Gait Yes   Ambulation/Gait Assistance 7: Independent   Ambulation Distance (Feet) 180 Feet   Assistive device None   Gait Pattern Decreased dorsiflexion - left;Decreased stance time - left;Decreased step length - right;Step-through pattern   Stairs Yes   Stairs Assistance  7: Independent   Stair Management Technique Alternating pattern;No rails;Forwards   Number of Stairs 12   Height of Stairs 8  8"   Gait Comments Pt. able to ascend/descend stairs x 12 without rail use and only slight instability on eccentric step down with L quad; with gait pt. still with decreased heel strike and stance time on L however able to self-correct for even stance time with cueing; still decreased heel strike due to gastroc tightness     Knee/Hip Exercises: Stretches   Gastroc Stretch Left;3 reps;30 seconds   Gastroc Stretch Limitations standing leaning into wall      Knee/Hip Exercises: Aerobic   Recumbent Bike L4 x 6 minutes     Knee/Hip Exercises: Machines for Strengthening   Cybex Knee Flexion BATCA HS curl 25# x 15 reps; B con/L ecc   "tightness" reported in posterior knee following     Knee/Hip Exercises: Standing   Heel Raises Limitations 20 reps at UBE; B con/L ecc   Terminal Knee Extension Limitations 5" x 20 - blue tband    Step Down Left;15 reps;3 sets;Hand Hold: 1   Step Down Limitations unable to keep good  technique with 6" however attempted                 PT Education - 09/20/16 0901    Education provided Yes   Education Details TKE with blue TB issued to pt., 4" eccentric step down    Person(s) Educated Patient   Methods Explanation;Demonstration;Verbal cues;Handout   Comprehension Verbalized understanding;Returned demonstration;Verbal cues required;Need further instruction          PT Short Term Goals - 09/20/16 0856      PT SHORT TERM GOAL #1   Title Patient to be independent with initial HEP for gentle stretching and strengthening (09/19/16)   Status Achieved           PT Long Term Goals - 09/20/16 3557      PT LONG TERM GOAL #1   Title Patient to be independent with advanced HEP (10/17/16)   Status Partially Met  5.10.18: met for current     PT LONG TERM GOAL #2   Title Patient to demonstrate proper gait mechanics with good  heel toe gait pattern (10/17/16)   Status On-going  5.10.18: Still with decreased stance time and heel strike on L however able to self correct      PT LONG TERM GOAL #3   Title Patient to improve L knee AROM equal to that of R knee with no pain (10/17/16)   Status On-going  5.10.18: 0-117 AROM in supine      PT LONG TERM GOAL #4   Title Patient to improve L LE gross strength to >/=4+/5 (10/17/16)   Status Partially Met  5.10.18: 4/5 with L knee flexion, extension strength     PT LONG TERM GOAL #5   Title Patient to demonstrate reciprocal gait pattern up/down 1 flight of stairs with good quad control (10/17/16)   Status Achieved               Plan - 09/20/16 0805    Clinical Impression Statement Pt. doing well with no complaints.  Pt. partially meeting strength goal today with L knee flexion/extension strength still 4/5.  Pt. L knee AROM 0-117 dg today which has greatly improved since last tested.  Pt. able to ascend/descend flight of stairs reciprocally, without rail support with only slight L quad instability.  Pt. gait mechanics improved however still with some decreased stance time and heel strike.  Good gait correction with cueing however limited carryover.  Pt. instructed to continue consistent calf muscle stretch for improved flexibility and gait mechanics.  HEP updated with more advanced eccentric quad strengthening activities.  Pt. making good progress with PT thus far.     PT Treatment/Interventions ADLs/Self Care Home Management;Cryotherapy;Electrical Stimulation;Moist Heat;Ultrasound;Neuromuscular re-education;Balance training;Therapeutic exercise;Therapeutic activities;Functional mobility training;Stair training;Gait training;Patient/family education;Manual techniques;Passive range of motion;Vasopneumatic Device;Taping;Dry needling;Iontophoresis 49m/ml Dexamethasone   PT Next Visit Plan Gentle stretching and strengthening      Patient will benefit from skilled therapeutic  intervention in order to improve the following deficits and impairments:  Abnormal gait, Decreased activity tolerance, Decreased balance, Decreased range of motion, Decreased mobility, Decreased strength, Difficulty walking, Increased edema, Pain  Visit Diagnosis: Acute pain of left knee  Stiffness of left knee, not elsewhere classified  Difficulty in walking, not elsewhere classified  Other abnormalities of gait and mobility  Muscle weakness (generalized)     Problem List There are no active problems to display for this patient.   MBess Harvest PTA 09/20/16 1:10 PM  [facility]  [address]  [place], [place], [address] Phone: [phone]   Fax:  [phone]  Name: Eric Leon MRN: [id number] Date of Birth: 1990-11-05

## 2016-09-25 ENCOUNTER — Ambulatory Visit: Payer: BLUE CROSS/BLUE SHIELD | Admitting: Physical Therapy

## 2016-10-01 ENCOUNTER — Ambulatory Visit: Payer: BLUE CROSS/BLUE SHIELD | Admitting: Physical Therapy

## 2016-10-01 DIAGNOSIS — M25562 Pain in left knee: Secondary | ICD-10-CM

## 2016-10-01 DIAGNOSIS — R262 Difficulty in walking, not elsewhere classified: Secondary | ICD-10-CM

## 2016-10-01 DIAGNOSIS — M6281 Muscle weakness (generalized): Secondary | ICD-10-CM

## 2016-10-01 DIAGNOSIS — M25662 Stiffness of left knee, not elsewhere classified: Secondary | ICD-10-CM

## 2016-10-01 DIAGNOSIS — R2689 Other abnormalities of gait and mobility: Secondary | ICD-10-CM

## 2016-10-01 NOTE — Therapy (Signed)
[facility] [place] [facility] [address]  [place], [place], [address] Phone: [phone]   Fax:  [phone]  Physical Therapy Treatment  Patient Details  Name: Eric Leon MRN: [id number] Date of Birth: 11/07/1990 Referring Provider: Dr. Victorino December  Encounter Date: 10/01/2016      PT End of Session - 10/01/16 1702    Visit Number 6   Number of Visits 12   Date for PT Re-Evaluation 10/17/16   PT Start Time 1700   PT Stop Time 1740   PT Time Calculation (min) 40 min   Activity Tolerance Patient tolerated treatment well   Behavior During Therapy [facility] for tasks assessed/performed      No past medical history on file.  Past Surgical History:  Procedure Laterality Date  . CLAVICLE SURGERY      There were no vitals filed for this visit.      Subjective Assessment - 10/01/16 1700    Subjective Saw MD - having surgery on Friday to repair meniscus   Diagnostic tests MRI - tear of L medial meniscus, torn PCL   Patient Stated Goals to not have surgery   Currently in Pain? No/denies   Multiple Pain Sites No                         [facility] Adult PT Treatment/Exercise - 10/01/16 1706      Knee/Hip Exercises: Aerobic   Recumbent Bike L4 x 7 min     Knee/Hip Exercises: Machines for Strengthening   Cybex Knee Extension 35# B LE x 10; 35# L LE eccentric x 15; 45# L LE eccentric x 15   Cybex Knee Flexion 45# B LE x 10; 25# L LE eccentric 2 x 15     Knee/Hip Exercises: Standing   Terminal Knee Extension Limitations 5" x 20 - black tband    Step Down Left;2 sets;20 reps;Step Height: 8"   Functional Squat 20 reps   Functional Squat Limitations on BOSU   Lunge Walking - Round Trips up and back x 2 - length of gym   SLS L LE with cone taps x 15 reps                  PT Short Term Goals - 09/20/16 0856      PT SHORT TERM GOAL #1   Title Patient to be independent with initial HEP for gentle stretching  and strengthening (09/19/16)   Status Achieved           PT Long Term Goals - 10/01/16 1804      PT LONG TERM GOAL #1   Title Patient to be independent with advanced HEP (10/17/16)   Status Achieved     PT LONG TERM GOAL #2   Title Patient to demonstrate proper gait mechanics with good heel toe gait pattern (10/17/16)   Status Achieved     PT LONG TERM GOAL #3   Title Patient to improve L knee AROM equal to that of R knee with no pain (10/17/16)   Status Achieved     PT LONG TERM GOAL #4   Title Patient to improve L LE gross strength to >/=4+/5 (10/17/16)   Status Partially Met     PT LONG TERM GOAL #5   Title Patient to demonstrate reciprocal gait pattern up/down 1 flight of stairs with good quad control (10/17/16)   Status Achieved  Plan - 10/01/16 1704    Clinical Impression Statement Eric Leon doing much better today as compared to initial eval. Good gait mechanics able to be demonstrated with good quad control through all phases of gait. Some muscle weakness continued with eccentric activities, however much improved. Patient reporting that he does plan to have surgery on 10/05/16 for meniscal repair. Education with PT to anticipate return to PT following surgery with good carryover. Will place patient on 30 day hold at this time and anticipate to see pateint follwoing surgery.    PT Treatment/Interventions ADLs/Self Care Home Management;Cryotherapy;Electrical Stimulation;Moist Heat;Ultrasound;Neuromuscular re-education;Balance training;Therapeutic exercise;Therapeutic activities;Functional mobility training;Stair training;Gait training;Patient/family education;Manual techniques;Passive range of motion;Vasopneumatic Device;Taping;Dry needling;Iontophoresis 5m/ml Dexamethasone   PT Next Visit Plan Gentle stretching and strengthening   Consulted and Agree with Plan of Care Patient      Patient will benefit from skilled therapeutic intervention in order to improve the  following deficits and impairments:  Abnormal gait, Decreased activity tolerance, Decreased balance, Decreased range of motion, Decreased mobility, Decreased strength, Difficulty walking, Increased edema, Pain  Visit Diagnosis: Acute pain of left knee  Stiffness of left knee, not elsewhere classified  Difficulty in walking, not elsewhere classified  Other abnormalities of gait and mobility  Muscle weakness (generalized)     Problem List There are no active problems to display for this patient.    SLanney Gins PT, DPT 10/01/16 6:10 PM   [place] [address] [place] [address]Phone: [phone]  Fax:  [phone] Name: Eric PodgorskiMRN: [id number] of Birth: 211-Nov-1992

## 2016-10-04 ENCOUNTER — Ambulatory Visit: Payer: BLUE CROSS/BLUE SHIELD | Admitting: Physical Therapy

## 2016-10-11 ENCOUNTER — Ambulatory Visit: Payer: BLUE CROSS/BLUE SHIELD | Admitting: Physical Therapy

## 2016-10-11 DIAGNOSIS — M25662 Stiffness of left knee, not elsewhere classified: Secondary | ICD-10-CM

## 2016-10-11 DIAGNOSIS — R262 Difficulty in walking, not elsewhere classified: Secondary | ICD-10-CM

## 2016-10-11 DIAGNOSIS — M25562 Pain in left knee: Secondary | ICD-10-CM

## 2016-10-11 DIAGNOSIS — M6281 Muscle weakness (generalized): Secondary | ICD-10-CM

## 2016-10-11 DIAGNOSIS — R2689 Other abnormalities of gait and mobility: Secondary | ICD-10-CM

## 2016-10-11 NOTE — Therapy (Signed)
[facility] [place] [address]  [address] [place], [place], [address] Phone: [phone]   Fax:  [phone]  Physical Therapy Treatment  Patient Details  Name: Eric Leon MRN: [id number] Date of Birth: [birth year] Referring Provider: Dr. Duwayne HeckJason Leon  Encounter Date: 10/11/2016      PT End of Session - 10/11/16 1635    Visit Number 1   Number of Visits 12   Date for PT Re-Evaluation 11/29/16   PT Start Time 1616   PT Stop Time 1700   PT Time Calculation (min) 44 min   Activity Tolerance Patient tolerated treatment well   Behavior During Therapy [facility] for tasks assessed/performed      No past medical history on file.  Past Surgical History:  Procedure Laterality Date  . CLAVICLE SURGERY      There were no vitals filed for this visit.      Subjective Assessment - 10/11/16 1617    Subjective Arthroscopy with Medial Meniscus repair on 10/05/16. Currently using single axillary crutch. Taking ibuprofen prn for pain. Returns to MD on Monday. No protocol given to patient - pt stating no knee flexion past 90 degrees, no full weightbearing (walk with crutch)   Diagnostic tests MRI - tear of L medial meniscus, torn PCL   Patient Stated Goals improve function, return to sport   Currently in Pain? No/denies   Multiple Pain Sites No            [facility] PT Assessment - 10/11/16 0001      ROM / Strength   AROM / PROM / Strength AROM;Strength     AROM   Right/Left Knee Left   Left Knee Extension 0   Left Knee Flexion 94     Strength   Overall Strength Comments strength not assessed today to protect surgical site due to surgery within 6 days; will plan to assess                     [facility] Treatment/Exercise - 10/11/16 0001      Knee/Hip Exercises: Stretches   Passive Hamstring Stretch Left;3 reps;60 seconds   Passive Hamstring Stretch Limitations supine with strap   Gastroc Stretch Left;3 reps;60 seconds    Gastroc Stretch Limitations supine with strap     Knee/Hip Exercises: Supine   Quad Sets Left;15 reps;2 sets   Straight Leg Raises Left;15 reps;2 sets     Modalities   Modalities Vasopneumatic     Vasopneumatic   Number Minutes Vasopneumatic  15 minutes   Vasopnuematic Location  Knee   Vasopneumatic Pressure High   Vasopneumatic Temperature  coldest temp                  PT Short Term Goals - 09/20/16 0856      PT SHORT TERM GOAL #1   Title Patient to be independent with initial HEP for gentle stretching and strengthening (09/19/16)   Status Achieved           PT Long Term Goals - 10/11/16 1636      PT LONG TERM GOAL #1   Title Patient to be independent with advanced HEP (11/29/16)   Status New     PT LONG TERM GOAL #2   Title Patient to demonstrate proper gait mechanics with good heel toe gait pattern (11/1916)   Status New     PT LONG TERM GOAL #3   Title Patient to improve L  knee AROM to 0-120 (11/29/16)   Status New     PT LONG TERM GOAL #4   Title Patient to improve L LE strength to >/= 4+/5 (11/29/16)   Status New     PT LONG TERM GOAL #5   Title Patient to demonstrate ability to ambulate up/down 3 flights of stairs with reciprocal gait pattern without use of handrail and good quad control (11/29/16)   Status New               Plan - 10/11/16 1653    Clinical Impression Statement Eric Leon s/p L knee arthroscopy with medial meniscus repair on 10/05/16. Patient today ambulating with single axillary crutch with approx 25-50% PWB on L LE. Patient with good AROM at this point with values listed above. PT session today focusing on ensuring good stretching complaince as well as initiating quad activiation with good quad set and SLR with minimal extensor lag. Patient to follow up with MD next week with PT requesting patient clarify WB status and ROM restrictions at that visit. Patient to benefit form PT to address ROM, strength and functional mobility  deficits to allow for return to PLOF.    PT Treatment/Interventions ADLs/Self Care Home Management;Cryotherapy;Electrical Stimulation;Moist Heat;Ultrasound;Neuromuscular re-education;Balance training;Therapeutic exercise;Therapeutic activities;Functional mobility training;Stair training;Gait training;Patient/family education;Manual techniques;Passive range of motion;Vasopneumatic Device;Taping;Dry needling;Iontophoresis 4mg /ml Dexamethasone   PT Next Visit Plan progress per protocol   Consulted and Agree with Plan of Care Patient      Patient will benefit from skilled therapeutic intervention in order to improve the following deficits and impairments:  Abnormal gait, Decreased activity tolerance, Decreased balance, Decreased range of motion, Decreased mobility, Decreased strength, Difficulty walking, Increased edema, Pain  Visit Diagnosis: Acute pain of left knee - Plan: PT plan of care cert/re-cert  Stiffness of left knee, not elsewhere classified - Plan: PT plan of care cert/re-cert  Difficulty in walking, not elsewhere classified - Plan: PT plan of care cert/re-cert  Other abnormalities of gait and mobility - Plan: PT plan of care cert/re-cert  Muscle weakness (generalized) - Plan: PT plan of care cert/re-cert     Problem List There are no active problems to display for this patient.   [name], PT, DPT 10/11/16 6:22 PM   [facility] [address]  [address] [place], [place], [address] Phone: [phone]   Fax:  [phone]  Name: Eric Leon MRN: [id number] Date of Birth: [birth year]

## 2016-10-18 ENCOUNTER — Ambulatory Visit: Payer: BLUE CROSS/BLUE SHIELD

## 2016-10-22 ENCOUNTER — Ambulatory Visit: Payer: BLUE CROSS/BLUE SHIELD | Attending: Orthopedic Surgery | Admitting: Physical Therapy

## 2016-10-22 DIAGNOSIS — R2689 Other abnormalities of gait and mobility: Secondary | ICD-10-CM | POA: Insufficient documentation

## 2016-10-22 DIAGNOSIS — M6281 Muscle weakness (generalized): Secondary | ICD-10-CM | POA: Insufficient documentation

## 2016-10-22 DIAGNOSIS — R262 Difficulty in walking, not elsewhere classified: Secondary | ICD-10-CM | POA: Diagnosis present

## 2016-10-22 DIAGNOSIS — M25562 Pain in left knee: Secondary | ICD-10-CM | POA: Diagnosis present

## 2016-10-22 DIAGNOSIS — M25662 Stiffness of left knee, not elsewhere classified: Secondary | ICD-10-CM | POA: Diagnosis present

## 2016-10-22 NOTE — Therapy (Signed)
[facility] [place] [address]  [address] [place], [place], [address] Phone: [phone]   Fax:  [phone]  Physical Therapy Treatment  Patient Details  Name: Eric Leon MRN: [id number] Date of Birth: 06/13/1990 Referring Provider: Dr. Duwayne HeckJason Rogers  Encounter Date: 10/22/2016      PT End of Session - 10/22/16 0802    Visit Number 2   Number of Visits 12   Date for PT Re-Evaluation 11/29/16   PT Start Time 0759   PT Stop Time 0901   PT Time Calculation (min) 62 min   Activity Tolerance Patient tolerated treatment well   Behavior During Therapy [facility] for tasks assessed/performed      No past medical history on file.  Past Surgical History:  Procedure Laterality Date  . CLAVICLE SURGERY      There were no vitals filed for this visit.      Subjective Assessment - 10/22/16 0802    Subjective Doing well - not in brace; reports only wearing brace when walking some.    Diagnostic tests MRI - tear of L medial meniscus, torn PCL   Patient Stated Goals improve function, return to sport   Currently in Pain? No/denies   Multiple Pain Sites No                         [facility] Adult PT Treatment/Exercise - 10/22/16 0804      Knee/Hip Exercises: Stretches   Passive Hamstring Stretch Left;3 reps;60 seconds   Passive Hamstring Stretch Limitations supine with strap   Gastroc Stretch Left;3 reps;60 seconds   Gastroc Stretch Limitations supine with strap     Knee/Hip Exercises: Supine   Quad Sets Left;20 reps   Heel Slides Left;15 reps   Heel Slides Limitations not past 90 degrees   Bridges Both;2 sets;15 reps   Bridges Limitations B LE extended on peanut ball   Straight Leg Raises Left;15 reps;2 sets   Straight Leg Raises Limitations 2nd set with 1#   Other Supine Knee/Hip Exercises dead bug x 15 reps   Other Supine Knee/Hip Exercises isometric hip flexion x 15     Knee/Hip Exercises: Sidelying   Hip ABduction  Left;2 sets;15 reps   Hip ABduction Limitations 1#     Knee/Hip Exercises: Prone   Hip Extension Left;2 sets;15 reps     Modalities   Modalities Vasopneumatic     Vasopneumatic   Number Minutes Vasopneumatic  15 minutes   Vasopnuematic Location  Knee   Vasopneumatic Pressure High   Vasopneumatic Temperature  coldest temp                  PT Short Term Goals - 09/20/16 0856      PT SHORT TERM GOAL #1   Title Patient to be independent with initial HEP for gentle stretching and strengthening (09/19/16)   Status Achieved           PT Long Term Goals - 10/22/16 0803      PT LONG TERM GOAL #1   Title Patient to be independent with advanced HEP (11/29/16)   Status On-going     PT LONG TERM GOAL #2   Title Patient to demonstrate proper gait mechanics with good heel toe gait pattern (11/1916)   Status On-going     PT LONG TERM GOAL #3   Title Patient to improve L knee AROM to 0-120 (11/29/16)   Status On-going  PT LONG TERM GOAL #4   Title Patient to improve L LE strength to >/= 4+/5 (11/29/16)   Status On-going     PT LONG TERM GOAL #5   Title Patient to demonstrate ability to ambulate up/down 3 flights of stairs with reciprocal gait pattern without use of handrail and good quad control (11/29/16)   Status On-going               Plan - 10/22/16 0804    Clinical Impression Statement Eric Leon with protocol and weightbearing status from MD today - Patient to be in locked extension brace with crutches for 50% PWB at all times, however patient reporting he has not been compliant with brace. Education on need to follow MD guidelines to prevent re-injury with good understanding. Completed all exercises per protocol today with good tolerance. PT and patient discussing reducing frequency during Phase 1 as protocol is limited and patient is able to do HEP independently at this time. WIll plan to see patient 1x/week during this phase to conserve visits, with heavy  education to be compliant with HEP and WB/ROM restrictions with good carryover. Will progress strength and ROM as outlined.    PT Treatment/Interventions ADLs/Self Care Home Management;Cryotherapy;Electrical Stimulation;Moist Heat;Ultrasound;Neuromuscular re-education;Balance training;Therapeutic exercise;Therapeutic activities;Functional mobility training;Stair training;Gait training;Patient/family education;Manual techniques;Passive range of motion;Vasopneumatic Device;Taping;Dry needling;Iontophoresis 4mg /ml Dexamethasone   PT Next Visit Plan progress per protocol; 50% WB unitl 6 weeks   Consulted and Agree with Plan of Care Patient      Patient will benefit from skilled therapeutic intervention in order to improve the following deficits and impairments:  Abnormal gait, Decreased activity tolerance, Decreased balance, Decreased range of motion, Decreased mobility, Decreased strength, Difficulty walking, Increased edema, Pain  Visit Diagnosis: Acute pain of left knee  Stiffness of left knee, not elsewhere classified  Difficulty in walking, not elsewhere classified  Other abnormalities of gait and mobility  Muscle weakness (generalized)     Problem List There are no active problems to display for this patient.   Eric Leon, PT, DPT 10/22/16 10:39 AM  [facility] [address]  [address] [place], [place], [address] Phone: [phone]   Fax:  [phone]  Name: Eric Leon MRN: [id number] Date of Birth: [birth year]

## 2016-10-22 NOTE — Patient Instructions (Signed)
Heel Slide   Bend knee and pull heel toward buttocks. Hold __10__ seconds. Return.  Repeat _15___ times. Do __2__ sessions per day. **Not going past 90 degrees**   Straight Leg Raise   Tighten stomach and slowly raise locked left leg _8___ inches from floor. Repeat __15__ times per set. Do __2__ sets per session.    ABDUCTION: Side-Lying (Active)   Lie on left side, top leg straight. Raise top leg as far as possible. Use __1_ lbs. Complete _2__ sets of _15__ repetitions.    Gastroc / Heel Cord Stretch - Seated With Towel   Sit on floor, towel around ball of foot. Gently pull foot in toward body, stretching heel cord and calf. Hold for _30__ seconds.  Repeat _3__ times. Do _2__ times per day.   Hamstring Step 2   Left foot relaxed, knee straight, other leg bent, foot flat. Raise straight leg further upward to maximal range. Hold _30__ seconds. Relax leg completely down. Repeat _3__ times.   (Home) Extension: Hip   With support under abdomen, tighten stomach. Lift right leg in line with body. Do not hyperextend. Alternate legs. Repeat __15__ times per set. Do __2__ sets per session.

## 2016-10-25 ENCOUNTER — Ambulatory Visit: Payer: BLUE CROSS/BLUE SHIELD | Admitting: Physical Therapy

## 2016-10-29 ENCOUNTER — Ambulatory Visit: Payer: BLUE CROSS/BLUE SHIELD

## 2016-10-29 DIAGNOSIS — M25662 Stiffness of left knee, not elsewhere classified: Secondary | ICD-10-CM

## 2016-10-29 DIAGNOSIS — M25562 Pain in left knee: Secondary | ICD-10-CM

## 2016-10-29 DIAGNOSIS — R2689 Other abnormalities of gait and mobility: Secondary | ICD-10-CM

## 2016-10-29 DIAGNOSIS — M6281 Muscle weakness (generalized): Secondary | ICD-10-CM

## 2016-10-29 DIAGNOSIS — R262 Difficulty in walking, not elsewhere classified: Secondary | ICD-10-CM

## 2016-10-29 NOTE — Therapy (Signed)
[facility] [place] [address]  [address] [place], [place], [address] Phone: [phone]   Fax:  [phone]  Physical Therapy Treatment  Patient Details  Name: Eric Leon MRN: [id number] Date of Birth: [birth year] Referring Provider: Dr. Duwayne HeckJason Rogers  Encounter Date: 10/29/2016      PT End of Session - 10/29/16 0806    Visit Number 3   Number of Visits 12   Date for PT Re-Evaluation 11/29/16   PT Start Time 0800   PT Stop Time 0835  pt. requesting to leave early    PT Time Calculation (min) 35 min   Activity Tolerance Patient tolerated treatment well   Behavior During Therapy [facility] for tasks assessed/performed      No past medical history on file.  Past Surgical History:  Procedure Laterality Date  . CLAVICLE SURGERY      There were no vitals filed for this visit.      Subjective Assessment - 10/29/16 0800    Subjective Pt. reports daily HEP performance without issue.  Pt. seen to start treatment not wearing knee brace and with Bilateral axillary crutches.     Patient Stated Goals improve function, return to sport   Currently in Pain? No/denies   Multiple Pain Sites No                         [facility] Adult PT Treatment/Exercise - 10/29/16 0811      Knee/Hip Exercises: Stretches   Passive Hamstring Stretch 2 reps;30 seconds   Passive Hamstring Stretch Limitations supine with strap   Gastroc Stretch 2 reps;30 seconds   Gastroc Stretch Limitations supine with strap   Other Knee/Hip Stretches L ITB stretch with strap 2 x 30 sec      Knee/Hip Exercises: Aerobic   Nustep L2, 6 min      Knee/Hip Exercises: Standing   Knee Flexion Left;20 reps   Knee Flexion Limitations #2; Chair support      Knee/Hip Exercises: Supine   Heel Slides Left;15 reps   Heel Slides Limitations not past 90 degrees   Hip Adduction Isometric Both;15 reps  5" pillow squeeze    Bridges Both;20 reps;1 set   Bridges Limitations  B LE extended on peanut ball   Straight Leg Raises Left;2 sets;20 reps   Straight Leg Raises Limitations 1#      Knee/Hip Exercises: Sidelying   Hip ABduction Left;2 sets;20 reps   Hip ABduction Limitations 1#   Hip ADduction Left;1 set;20 reps   Hip ADduction Limitations 2#      Manual Therapy   Manual Therapy Joint mobilization   Joint Mobilization L patellar mobs all directions; pain free however mild restriction in mobility                 PT Education - 10/29/16 0827    Education provided Yes   Education Details hip adduction SLR, adduction ball squeeze, Standing HS curl (not past 90 dg)   Person(s) Educated Patient   Methods Explanation;Demonstration;Verbal cues;Handout   Comprehension Verbalized understanding;Returned demonstration;Verbal cues required;Need further instruction          PT Short Term Goals - 09/20/16 0856      PT SHORT TERM GOAL #1   Title Patient to be independent with initial HEP for gentle stretching and strengthening (09/19/16)   Status Achieved           PT Long Term Goals - 10/22/16 [id number]  PT LONG TERM GOAL #1   Title Patient to be independent with advanced HEP (11/29/16)   Status On-going     PT LONG TERM GOAL #2   Title Patient to demonstrate proper gait mechanics with good heel toe gait pattern (11/1916)   Status On-going     PT LONG TERM GOAL #3   Title Patient to improve L knee AROM to 0-120 (11/29/16)   Status On-going     PT LONG TERM GOAL #4   Title Patient to improve L LE strength to >/= 4+/5 (11/29/16)   Status On-going     PT LONG TERM GOAL #5   Title Patient to demonstrate ability to ambulate up/down 3 flights of stairs with reciprocal gait pattern without use of handrail and good quad control (11/29/16)   Status On-going               Plan - 10/29/16 0806    Clinical Impression Statement Pt. seen to start treatment ambulating with bilateral axillary crutches observing 50% PWB status however not wearing  knee brace.  Pt. admitted to inconsistent wear of knee brace and was educated on need to wear knee brace in full extension per protocol and MD order.  Pt. verbalized understanding of this.  Some progression of therex strengthening activity today and HEP updated.  Pt. tolerated all activities well pain free.  Some limited patellar mobility today with manual work.  Pt. educated to end treatment on need to wear extension knee brace and continue with 50% PWB status.     PT Treatment/Interventions ADLs/Self Care Home Management;Cryotherapy;Electrical Stimulation;Moist Heat;Ultrasound;Neuromuscular re-education;Balance training;Therapeutic exercise;Therapeutic activities;Functional mobility training;Stair training;Gait training;Patient/family education;Manual techniques;Passive range of motion;Vasopneumatic Device;Taping;Dry needling;Iontophoresis 4mg /ml Dexamethasone   PT Next Visit Plan progress per protocol; 50% WB unitl 6 weeks      Patient will benefit from skilled therapeutic intervention in order to improve the following deficits and impairments:  Abnormal gait, Decreased activity tolerance, Decreased balance, Decreased range of motion, Decreased mobility, Decreased strength, Difficulty walking, Increased edema, Pain  Visit Diagnosis: Acute pain of left knee  Stiffness of left knee, not elsewhere classified  Difficulty in walking, not elsewhere classified  Other abnormalities of gait and mobility  Muscle weakness (generalized)     Problem List There are no active problems to display for this patient.   Eric Leon, PTA 10/29/16 8:52 AM  [facility] [address]  [address] [place], [place], [address] Phone: [phone]   Fax:  [phone]  Name: Eric Leon MRN: [id number] Date of Birth: 1990/10/19

## 2016-11-01 ENCOUNTER — Ambulatory Visit: Payer: BLUE CROSS/BLUE SHIELD | Admitting: Physical Therapy

## 2016-11-05 ENCOUNTER — Ambulatory Visit: Payer: BLUE CROSS/BLUE SHIELD | Admitting: Physical Therapy

## 2016-11-05 DIAGNOSIS — M25562 Pain in left knee: Secondary | ICD-10-CM | POA: Diagnosis not present

## 2016-11-05 DIAGNOSIS — R2689 Other abnormalities of gait and mobility: Secondary | ICD-10-CM

## 2016-11-05 DIAGNOSIS — M25662 Stiffness of left knee, not elsewhere classified: Secondary | ICD-10-CM

## 2016-11-05 DIAGNOSIS — M6281 Muscle weakness (generalized): Secondary | ICD-10-CM

## 2016-11-05 DIAGNOSIS — R262 Difficulty in walking, not elsewhere classified: Secondary | ICD-10-CM

## 2016-11-05 NOTE — Therapy (Signed)
[facility] [address]  [address] [place], [place], [address] Phone: [phone]   Fax:  [phone]  Physical Therapy Treatment  Patient Details  Name: Eric Leon MRN: [id number] Date of Birth: 06/04/1990 Referring Provider: Dr. Duwayne Heck  Encounter Date: 11/05/2016      PT End of Session - 11/05/16 0804    Visit Number 4   Number of Visits 12   Date for PT Re-Evaluation 11/29/16   PT Start Time 0759   PT Stop Time 0840   PT Time Calculation (min) 41 min   Activity Tolerance Patient tolerated treatment well   Behavior During Therapy [facility] for tasks assessed/performed      No past medical history on file.  Past Surgical History:  Procedure Laterality Date  . CLAVICLE SURGERY      There were no vitals filed for this visit.      Subjective Assessment - 11/05/16 0802    Subjective Walking with 2 crutches - not using brace   Diagnostic tests MRI - tear of L medial meniscus, torn PCL   Patient Stated Goals improve function, return to sport   Currently in Pain? No/denies   Multiple Pain Sites No            [facility] PT Assessment - 11/05/16 0001      AROM   AROM Assessment Site Knee   Right/Left Knee Left   Left Knee Extension -2   Left Knee Flexion 110                     [facility] Adult PT Treatment/Exercise - 11/05/16 0805      Knee/Hip Exercises: Stretches   Gastroc Stretch Left;3 reps;30 seconds   Gastroc Stretch Limitations supine with strap     Knee/Hip Exercises: Aerobic   Nustep L5 x 6 minutes     Knee/Hip Exercises: Standing   Hip Flexion Left;15 reps;Knee bent   Hip Flexion Limitations 3#; standing on AirEx   Hip Abduction Left;15 reps;Knee straight   Abduction Limitations 3#; standing on AirEx   Hip Extension Left;15 reps   Extension Limitations 3#; standing on AirEx   Functional Squat 15 reps   Functional Squat Limitations "mini"      Knee/Hip Exercises: Supine   Bridges  Strengthening;Both;15 reps   Bridges Limitations blue tband at knees   Straight Leg Raises Strengthening;Left;2 sets;15 reps   Straight Leg Raises Limitations 3#   Straight Leg Raise with External Rotation Strengthening;Left;2 sets;15 reps   Straight Leg Raise with External Rotation Limitations 3#     Knee/Hip Exercises: Sidelying   Hip ABduction Strengthening;Left;2 sets;15 reps   Hip ABduction Limitations 3#   Hip ADduction Strengthening;Left;2 sets;15 reps   Hip ADduction Limitations 3#     Manual Therapy   Manual Therapy Joint mobilization   Joint Mobilization L patellar mobs - all planes - good movement and pain free                  PT Short Term Goals - 09/20/16 0856      PT SHORT TERM GOAL #1   Title Patient to be independent with initial HEP for gentle stretching and strengthening (09/19/16)   Status Achieved           PT Long Term Goals - 10/22/16 0803      PT LONG TERM GOAL #1   Title Patient to be independent with advanced HEP (11/29/16)   Status  On-going     PT LONG TERM GOAL #2   Title Patient to demonstrate proper gait mechanics with good heel toe gait pattern (11/1916)   Status On-going     PT LONG TERM GOAL #3   Title Patient to improve L knee AROM to 0-120 (11/29/16)   Status On-going     PT LONG TERM GOAL #4   Title Patient to improve L LE strength to >/= 4+/5 (11/29/16)   Status On-going     PT LONG TERM GOAL #5   Title Patient to demonstrate ability to ambulate up/down 3 flights of stairs with reciprocal gait pattern without use of handrail and good quad control (11/29/16)   Status On-going               Plan - 11/05/16 0804    Clinical Impression Statement Patient doing well wtih all phase I activities - currently ambulating with 2 axillary crutches, however does report limited use of brace. Patient reports no buckling or LOB of L LE with ambulation. Good progression of AROM at L knee with ability to achieve 110 degrees of  flexion, as well as good progression of all strengthing with no evidence of extension lag. Good patella mobility noted today in all planes with no pain produced. Patient to follow-up with MD on 11/09/16. Will continue to progress per protocol unless instructed otherwise per MD.    PT Treatment/Interventions ADLs/Self Care Home Management;Cryotherapy;Electrical Stimulation;Moist Heat;Ultrasound;Neuromuscular re-education;Balance training;Therapeutic exercise;Therapeutic activities;Functional mobility training;Stair training;Gait training;Patient/family education;Manual techniques;Passive range of motion;Vasopneumatic Device;Taping;Dry needling;Iontophoresis 4mg /ml Dexamethasone   PT Next Visit Plan progress per protocol; 50% WB unitl 6 weeks   Consulted and Agree with Plan of Care Patient      Patient will benefit from skilled therapeutic intervention in order to improve the following deficits and impairments:  Abnormal gait, Decreased activity tolerance, Decreased balance, Decreased range of motion, Decreased mobility, Decreased strength, Difficulty walking, Increased edema, Pain  Visit Diagnosis: Acute pain of left knee  Stiffness of left knee, not elsewhere classified  Difficulty in walking, not elsewhere classified  Other abnormalities of gait and mobility  Muscle weakness (generalized)     Problem List There are no active problems to display for this patient.   Kipp LaurenceStephanie R Lorene Samaan, PT, DPT 11/05/16 8:44 AM   [facility] [place] [address]  [address] [place], [place], [address] Phone: [phone]   Fax:  [phone]  Name: Eric Leon MRN: [id number] Date of Birth: Sep 07, 1990

## 2016-11-08 ENCOUNTER — Ambulatory Visit: Payer: BLUE CROSS/BLUE SHIELD | Admitting: Physical Therapy

## 2016-11-12 ENCOUNTER — Ambulatory Visit: Payer: BLUE CROSS/BLUE SHIELD | Admitting: Physical Therapy

## 2016-11-15 ENCOUNTER — Ambulatory Visit: Payer: BLUE CROSS/BLUE SHIELD | Attending: Orthopedic Surgery | Admitting: Physical Therapy

## 2016-11-15 DIAGNOSIS — R2689 Other abnormalities of gait and mobility: Secondary | ICD-10-CM | POA: Diagnosis present

## 2016-11-15 DIAGNOSIS — M6281 Muscle weakness (generalized): Secondary | ICD-10-CM | POA: Diagnosis present

## 2016-11-15 DIAGNOSIS — M25562 Pain in left knee: Secondary | ICD-10-CM | POA: Insufficient documentation

## 2016-11-15 DIAGNOSIS — M25662 Stiffness of left knee, not elsewhere classified: Secondary | ICD-10-CM | POA: Insufficient documentation

## 2016-11-15 DIAGNOSIS — R262 Difficulty in walking, not elsewhere classified: Secondary | ICD-10-CM | POA: Insufficient documentation

## 2016-11-15 NOTE — Therapy (Signed)
[facility] [place] [address]  [address] [place], [place], [address] Phone: [phone]   Fax:  [phone]  Physical Therapy Treatment  Patient Details  Name: Eric Leon MRN: [id number] Date of Birth: 1991-05-11 Referring Provider: Dr. Duwayne HeckJason Rogers  Encounter Date: 11/15/2016      PT End of Session - 11/15/16 0804    Visit Number 5   Number of Visits 12   Date for PT Re-Evaluation 11/29/16   PT Start Time 0800   PT Stop Time 0839   PT Time Calculation (min) 39 min   Activity Tolerance Patient tolerated treatment well   Behavior During Therapy [facility] for tasks assessed/performed      No past medical history on file.  Past Surgical History:  Procedure Laterality Date  . CLAVICLE SURGERY      There were no vitals filed for this visit.      Subjective Assessment - 11/15/16 0804    Subjective Saw MD - able to be off crutches - at 6 weeks mark   Diagnostic tests MRI - tear of L medial meniscus, torn PCL   Patient Stated Goals improve function, return to sport   Currently in Pain? No/denies   Multiple Pain Sites No            [facility] PT Assessment - 11/15/16 0001      AROM   Left Knee Extension -1   Left Knee Flexion 107                     [facility] Adult PT Treatment/Exercise - 11/15/16 0001      Knee/Hip Exercises: Stretches   Gastroc Stretch Left;3 reps;30 seconds   Gastroc Stretch Limitations prostretch     Knee/Hip Exercises: Aerobic   Recumbent Bike L2 x 6 minutes     Knee/Hip Exercises: Standing   Heel Raises Both;15 reps   Heel Raises Limitations toe raises x 15 reps   Functional Squat 15 reps   Functional Squat Limitations TRX   SLS L SLS on foam 5 x 20   Other Standing Knee Exercises side stepping 30 feet    Other Standing Knee Exercises fwd/bwd monster walks 2 x 30 feet each direction     Knee/Hip Exercises: Supine   Single Leg Bridge Strengthening;Left;15 reps   Straight Leg Raises  Strengthening;Left;15 reps   Straight Leg Raises Limitations 4#   Straight Leg Raise with External Rotation Strengthening;Left;15 reps   Straight Leg Raise with External Rotation Limitations 4#   Other Supine Knee/Hip Exercises dead bug x 15 reps                  PT Short Term Goals - 09/20/16 [id number]      PT SHORT TERM GOAL #1   Title Patient to be independent with initial HEP for gentle stretching and strengthening (09/19/16)   Status Achieved           PT Long Term Goals - 10/22/16 0803      PT LONG TERM GOAL #1   Title Patient to be independent with advanced HEP (11/29/16)   Status On-going     PT LONG TERM GOAL #2   Title Patient to demonstrate proper gait mechanics with good heel toe gait pattern (11/1916)   Status On-going     PT LONG TERM GOAL #3   Title Patient to improve L knee AROM to 0-120 (11/29/16)   Status On-going  PT LONG TERM GOAL #4   Title Patient to improve L LE strength to >/= 4+/5 (11/29/16)   Status On-going     PT LONG TERM GOAL #5   Title Patient to demonstrate ability to ambulate up/down 3 flights of stairs with reciprocal gait pattern without use of handrail and good quad control (11/29/16)   Status On-going               Plan - 11/15/16 0805    Clinical Impression Statement Patient at 6 weeks post-op. Progressing well with initiation of Phase II exercises with no issue. Does demonstrate slight limp at beginning of gait cycle with VC to encourage full weight bearing as patient is able but has tendency to hop on R LE. Patient wearing brace to PT - able to complete at activities in session with no buckling or instability. Per protocol, patient able to be out of brace of full extension achieved, however PT educating patient on need to have brace nearby if walking for prolonged periods to prevent buckling in the event of fatigue.    PT Treatment/Interventions ADLs/Self Care Home Management;Cryotherapy;Electrical Stimulation;Moist  Heat;Ultrasound;Neuromuscular re-education;Balance training;Therapeutic exercise;Therapeutic activities;Functional mobility training;Stair training;Gait training;Patient/family education;Manual techniques;Passive range of motion;Vasopneumatic Device;Taping;Dry needling;Iontophoresis 4mg /ml Dexamethasone   PT Next Visit Plan progress per protocol - wokring in Phase II   Consulted and Agree with Plan of Care Patient      Patient will benefit from skilled therapeutic intervention in order to improve the following deficits and impairments:  Abnormal gait, Decreased activity tolerance, Decreased balance, Decreased range of motion, Decreased mobility, Decreased strength, Difficulty walking, Increased edema, Pain  Visit Diagnosis: Acute pain of left knee  Stiffness of left knee, not elsewhere classified  Difficulty in walking, not elsewhere classified  Other abnormalities of gait and mobility  Muscle weakness (generalized)     Problem List There are no active problems to display for this patient.    [name], PT, DPT 11/15/16 8:40 AM   [facility] [address]  [address] [place], [place], [address] Phone: [phone]   Fax:  [phone]  Name: Eric Leon MRN: [id number] Date of Birth: 25-May-1990

## 2016-11-23 ENCOUNTER — Ambulatory Visit: Payer: BLUE CROSS/BLUE SHIELD

## 2016-12-03 ENCOUNTER — Ambulatory Visit: Payer: BLUE CROSS/BLUE SHIELD | Admitting: Physical Therapy

## 2016-12-03 DIAGNOSIS — M25562 Pain in left knee: Secondary | ICD-10-CM

## 2016-12-03 DIAGNOSIS — R262 Difficulty in walking, not elsewhere classified: Secondary | ICD-10-CM

## 2016-12-03 DIAGNOSIS — M6281 Muscle weakness (generalized): Secondary | ICD-10-CM

## 2016-12-03 DIAGNOSIS — M25662 Stiffness of left knee, not elsewhere classified: Secondary | ICD-10-CM

## 2016-12-03 DIAGNOSIS — R2689 Other abnormalities of gait and mobility: Secondary | ICD-10-CM

## 2016-12-03 NOTE — Therapy (Signed)
[facility] [address]  [address] [place], [place], [address] Phone: [phone]   Fax:  [phone]  Physical Therapy Treatment  Patient Details  Name: Eric Leon MRN: [id number] Date of Birth: 1990-07-22 Referring Provider: Dr. Duwayne Heck  Encounter Date: 12/03/2016      PT End of Session - 12/03/16 1616    Visit Number 6   Date for PT Re-Evaluation 01/14/17   PT Start Time 1615   PT Stop Time 1701   PT Time Calculation (min) 46 min   Activity Tolerance Patient tolerated treatment well   Behavior During Therapy [facility] for tasks assessed/performed      No past medical history on file.  Past Surgical History:  Procedure Laterality Date  . CLAVICLE SURGERY      There were no vitals filed for this visit.      Subjective Assessment - 12/03/16 1617    Subjective feeling well - wears brace "some"   Diagnostic tests MRI - tear of L medial meniscus, torn PCL   Patient Stated Goals improve function, return to sport   Currently in Pain? No/denies   Multiple Pain Sites No                         [facility] Adult PT Treatment/Exercise - 12/03/16 0001      Knee/Hip Exercises: Aerobic   Recumbent Bike L3 x 6 min     Knee/Hip Exercises: Standing   Heel Raises Limitations toe raises x 15 reps   Hip Flexion Left;15 reps;Knee straight   Hip Flexion Limitations green tband   Hip ADduction Left;15 reps   Hip ADduction Limitations green tband   Hip Abduction Left;15 reps;Knee straight   Abduction Limitations green tband   Hip Extension Left;15 reps;Knee straight   Extension Limitations green tband   Functional Squat 2 sets;15 reps;3 seconds   Functional Squat Limitations TRX; 2nd set with heel raise   Wall Squat 15 reps;5 seconds   Wall Squat Limitations with ball squeeze   SLS L SLS on oval foam - 3 cone taps x 15 reps     Knee/Hip Exercises: Supine   Single Leg Bridge Strengthening;Left;15 reps    Straight Leg Raises Strengthening;Left;15 reps   Straight Leg Raises Limitations 4#   Straight Leg Raise with External Rotation Strengthening;Left;15 reps   Straight Leg Raise with External Rotation Limitations 4#   Other Supine Knee/Hip Exercises dead bug x 15 reps   Other Supine Knee/Hip Exercises DL straight leg bridge x 15 reps; DL straight leg bridge + HS curl x 15     Knee/Hip Exercises: Sidelying   Hip ABduction Strengthening;Left;15 reps   Hip ABduction Limitations 4#     Knee/Hip Exercises: Prone   Hip Extension Left;2 sets;15 reps   Hip Extension Limitations 4#                  PT Short Term Goals - 09/20/16 0856      PT SHORT TERM GOAL #1   Title Patient to be independent with initial HEP for gentle stretching and strengthening (09/19/16)   Status Achieved           PT Long Term Goals - 12/03/16 1618      PT LONG TERM GOAL #1   Title Patient to be independent with advanced HEP   Target Date 01/14/17     PT LONG TERM GOAL #2   Title Patient  to demonstrate proper gait mechanics with good heel toe gait pattern (11/1916)   Status Achieved     PT LONG TERM GOAL #3   Title Patient to improve L knee AROM to 0-120    Status On-going   Target Date 01/14/17     PT LONG TERM GOAL #4   Title Patient to improve L LE strength to >/= 4+/5   Status On-going   Target Date 01/14/17     PT LONG TERM GOAL #5   Title Patient to demonstrate ability to ambulate up/down 3 flights of stairs with reciprocal gait pattern without use of handrail and good quad control (11/29/16)   Status Achieved     Additional Long Term Goals   Additional Long Term Goals Yes     PT LONG TERM GOAL #6   Title patient to demonstrate good running/jumping/landing mechanics as MD and protocol allows   Status New   Target Date 01/14/17               Plan - 12/03/16 1617    Clinical Impression Statement Patient today 8 weeks post-op. Small lapse in treatment time due to paitnet  vacations. Patient doing well with all protocol progressions today with no pain, only expected muscle fatigue. Patient without brace today with good mechanics and no evidence of instability at L knee. Will plan to continue to progress as MD and protocol allows.    PT Treatment/Interventions ADLs/Self Care Home Management;Cryotherapy;Electrical Stimulation;Moist Heat;Ultrasound;Neuromuscular re-education;Balance training;Therapeutic exercise;Therapeutic activities;Functional mobility training;Stair training;Gait training;Patient/family education;Manual techniques;Passive range of motion;Vasopneumatic Device;Taping;Dry needling;Iontophoresis 4mg /ml Dexamethasone   PT Next Visit Plan progress per protocol - wokring in Phase II   Consulted and Agree with Plan of Care Patient      Patient will benefit from skilled therapeutic intervention in order to improve the following deficits and impairments:  Abnormal gait, Decreased activity tolerance, Decreased balance, Decreased range of motion, Decreased mobility, Decreased strength, Difficulty walking, Increased edema, Pain  Visit Diagnosis: Acute pain of left knee - Plan: PT plan of care cert/re-cert  Stiffness of left knee, not elsewhere classified - Plan: PT plan of care cert/re-cert  Difficulty in walking, not elsewhere classified - Plan: PT plan of care cert/re-cert  Other abnormalities of gait and mobility - Plan: PT plan of care cert/re-cert  Muscle weakness (generalized) - Plan: PT plan of care cert/re-cert     Problem List There are no active problems to display for this patient.   Kipp LaurenceStephanie R Latrice Storlie, PT, DPT 12/03/16 5:28 PM   [facility] [place] [address]  [address] [place], [place], [address] Phone: [phone]   Fax:  [phone]  Name: Eric Leon MRN: [id number] Date of Birth: 09/19/1990

## 2016-12-12 ENCOUNTER — Ambulatory Visit: Payer: BLUE CROSS/BLUE SHIELD | Attending: Orthopedic Surgery | Admitting: Physical Therapy

## 2016-12-12 DIAGNOSIS — M25662 Stiffness of left knee, not elsewhere classified: Secondary | ICD-10-CM | POA: Insufficient documentation

## 2016-12-12 DIAGNOSIS — R262 Difficulty in walking, not elsewhere classified: Secondary | ICD-10-CM | POA: Diagnosis present

## 2016-12-12 DIAGNOSIS — M25562 Pain in left knee: Secondary | ICD-10-CM | POA: Diagnosis present

## 2016-12-12 DIAGNOSIS — R2689 Other abnormalities of gait and mobility: Secondary | ICD-10-CM | POA: Insufficient documentation

## 2016-12-12 DIAGNOSIS — M6281 Muscle weakness (generalized): Secondary | ICD-10-CM | POA: Diagnosis present

## 2016-12-12 NOTE — Therapy (Signed)
[facility] [place] [address]  [address] [place], [place], [address] Phone: [phone]   Fax:  [phone]  Physical Therapy Treatment  Patient Details  Name: Eric Leon MRN: [id number] Date of Birth: 1990-07-28 Referring Provider: Dr. Duwayne HeckJason Rogers  Encounter Date: 12/12/2016      PT End of Session - 12/12/16 1707    Visit Number 7   Date for PT Re-Evaluation 01/14/17   PT Start Time 1702   PT Stop Time 1740   PT Time Calculation (min) 38 min   Activity Tolerance Patient tolerated treatment well   Behavior During Therapy [facility] for tasks assessed/performed      No past medical history on file.  Past Surgical History:  Procedure Laterality Date  . CLAVICLE SURGERY      There were no vitals filed for this visit.      Subjective Assessment - 12/12/16 1707    Subjective no new complaints - went jet skiing   Diagnostic tests MRI - tear of L medial meniscus, torn PCL   Patient Stated Goals improve function, return to sport   Currently in Pain? No/denies   Multiple Pain Sites No                         [facility] Adult PT Treatment/Exercise - 12/12/16 1709      Knee/Hip Exercises: Stretches   Other Knee/Hip Stretches foam roller to quad - 3 way (toes in/down/out) x 1 min each     Knee/Hip Exercises: Aerobic   Elliptical L5 x 6 min     Knee/Hip Exercises: Machines for Strengthening   Cybex Leg Press 25# B LE x 15; 25# B con/L ecc x 15     Knee/Hip Exercises: Standing   Step Down Left;15 reps;Hand Hold: 2;Step Height: 6"   Step Down Limitations eccentric   Functional Squat 15 reps   Functional Squat Limitations TRX + heel raise   SLS L SL squat stance on BOSU - ball throws to rebounder   SLS with Vectors DL squat stance on BOSU - ball throws to rebounder   Other Standing Knee Exercises side stepping 30 feet each direction - green tband at ankles   Other Standing Knee Exercises fwd/bwd monster walks 2 x 30  feet each direction - green tband at ankles     Knee/Hip Exercises: Supine   Straight Leg Raise with External Rotation Strengthening;Left;2 sets;15 reps   Straight Leg Raise with External Rotation Limitations 5#   Other Supine Knee/Hip Exercises straight leg bridge + HS curl x 15                  PT Short Term Goals - 09/20/16 [id number]      PT SHORT TERM GOAL #1   Title Patient to be independent with initial HEP for gentle stretching and strengthening (09/19/16)   Status Achieved           PT Long Term Goals - 12/03/16 1618      PT LONG TERM GOAL #1   Title Patient to be independent with advanced HEP   Target Date 01/14/17     PT LONG TERM GOAL #2   Title Patient to demonstrate proper gait mechanics with good heel toe gait pattern (11/1916)   Status Achieved     PT LONG TERM GOAL #3   Title Patient to improve L knee AROM to 0-120    Status On-going  Target Date 01/14/17     PT LONG TERM GOAL #4   Title Patient to improve L LE strength to >/= 4+/5   Status On-going   Target Date 01/14/17     PT LONG TERM GOAL #5   Title Patient to demonstrate ability to ambulate up/down 3 flights of stairs with reciprocal gait pattern without use of handrail and good quad control (11/29/16)   Status Achieved     Additional Long Term Goals   Additional Long Term Goals Yes     PT LONG TERM GOAL #6   Title patient to demonstrate good running/jumping/landing mechanics as MD and protocol allows   Status New   Target Date 01/14/17               Plan - 12/12/16 1707    Clinical Impression Statement Patient nearly 10 week post-op today. Good progress within protocol. Some strength deficits noted with squat stance and eccentric step down. Will continue to progress within protocol to prepare patient for next phase.    PT Treatment/Interventions ADLs/Self Care Home Management;Cryotherapy;Electrical Stimulation;Moist Heat;Ultrasound;Neuromuscular re-education;Balance  training;Therapeutic exercise;Therapeutic activities;Functional mobility training;Stair training;Gait training;Patient/family education;Manual techniques;Passive range of motion;Vasopneumatic Device;Taping;Dry needling;Iontophoresis 4mg /ml Dexamethasone   PT Next Visit Plan progress per protocol - wokring in Phase II   Consulted and Agree with Plan of Care Patient      Patient will benefit from skilled therapeutic intervention in order to improve the following deficits and impairments:  Abnormal gait, Decreased activity tolerance, Decreased balance, Decreased range of motion, Decreased mobility, Decreased strength, Difficulty walking, Increased edema, Pain  Visit Diagnosis: Acute pain of left knee  Stiffness of left knee, not elsewhere classified  Difficulty in walking, not elsewhere classified  Other abnormalities of gait and mobility  Muscle weakness (generalized)     Problem List There are no active problems to display for this patient.   Kipp LaurenceStephanie R Aaron, PT, DPT 12/12/16 5:42 PM   [facility] [place] [address]  [address] [place], [place], [address] Phone: [phone]   Fax:  [phone]  Name: Eric Leon MRN: [id number] Date of Birth: 01/24/1991

## 2016-12-19 ENCOUNTER — Ambulatory Visit: Payer: BLUE CROSS/BLUE SHIELD | Admitting: Physical Therapy

## 2016-12-19 DIAGNOSIS — R262 Difficulty in walking, not elsewhere classified: Secondary | ICD-10-CM

## 2016-12-19 DIAGNOSIS — M25662 Stiffness of left knee, not elsewhere classified: Secondary | ICD-10-CM

## 2016-12-19 DIAGNOSIS — M25562 Pain in left knee: Secondary | ICD-10-CM

## 2016-12-19 DIAGNOSIS — R2689 Other abnormalities of gait and mobility: Secondary | ICD-10-CM

## 2016-12-19 DIAGNOSIS — M6281 Muscle weakness (generalized): Secondary | ICD-10-CM

## 2016-12-19 NOTE — Therapy (Signed)
[facility] [address]  [address] [place], [place], [address] Phone: [phone]   Fax:  [phone]  Physical Therapy Treatment  Patient Details  Name: Eric Leon MRN: [id number] Date of Birth: Jun 07, 1990 Referring Provider: Dr. Duwayne Leon  Encounter Date: 12/19/2016      PT End of Session - 12/19/16 1709    Visit Number 8   Date for PT Re-Evaluation 01/14/17   PT Start Time 1705   PT Stop Time 1747   PT Time Calculation (min) 42 min   Activity Tolerance Patient tolerated treatment well   Behavior During Therapy [facility] for tasks assessed/performed      No past medical history on file.  Past Surgical History:  Procedure Laterality Date  . CLAVICLE SURGERY      There were no vitals filed for this visit.      Subjective Assessment - 12/19/16 1709    Subjective no new complaints   Diagnostic tests MRI - tear of L medial meniscus, torn PCL   Patient Stated Goals improve function, return to sport   Currently in Pain? No/denies                         [facility] Adult PT Treatment/Exercise - 12/19/16 1710      Knee/Hip Exercises: Aerobic   Elliptical L6 x 6 min     Knee/Hip Exercises: Machines for Strengthening   Cybex Leg Press 35# B LE x 15 reps; 35# L LE x 15     Knee/Hip Exercises: Standing   Functional Squat 15 reps;2 sets   Functional Squat Limitations TRX; 2nd set with heel raise   SLS L SL squat stance on BOSU - ball throws to rebounder   SLS with Vectors DL squat stance on BOSU - ball throws to [employer]     Knee/Hip Exercises: Seated   Stool Scoot - Round Trips 100 feet - HS focused     Knee/Hip Exercises: Supine   Single Leg Bridge Strengthening;Left;15 reps  foot propped on foam roller   Other Supine Knee/Hip Exercises core walkouts on green pball                  PT Short Term Goals - 09/20/16 0856      PT SHORT TERM GOAL #1   Title Patient to be independent with  initial HEP for gentle stretching and strengthening (09/19/16)   Status Achieved           PT Long Term Goals - 12/03/16 1618      PT LONG TERM GOAL #1   Title Patient to be independent with advanced HEP   Target Date 01/14/17     PT LONG TERM GOAL #2   Title Patient to demonstrate proper gait mechanics with good heel toe gait pattern (11/1916)   Status Achieved     PT LONG TERM GOAL #3   Title Patient to improve L knee AROM to 0-120    Status On-going   Target Date 01/14/17     PT LONG TERM GOAL #4   Title Patient to improve L LE strength to >/= 4+/5   Status On-going   Target Date 01/14/17     PT LONG TERM GOAL #5   Title Patient to demonstrate ability to ambulate up/down 3 flights of stairs with reciprocal gait pattern without use of handrail and good quad control (11/29/16)   Status Achieved  Additional Long Term Goals   Additional Long Term Goals Yes     PT LONG TERM GOAL #6   Title patient to demonstrate good running/jumping/landing mechanics as MD and protocol allows   Status New   Target Date 01/14/17               Plan - 12/19/16 1710    Clinical Impression Statement Eric Leon doing well with all tasks within limits of protocol. WIll likely be able to progress into next phase of protocol next week as patient will be 12 weeks post op. Good work todya with all HS strengthening as well as DL and SL proprioception activities.    PT Treatment/Interventions ADLs/Self Care Home Management;Cryotherapy;Electrical Stimulation;Moist Heat;Ultrasound;Neuromuscular re-education;Balance training;Therapeutic exercise;Therapeutic activities;Functional mobility training;Stair training;Gait training;Patient/family education;Manual techniques;Passive range of motion;Vasopneumatic Device;Taping;Dry needling;Iontophoresis 4mg /ml Dexamethasone   PT Next Visit Plan progress per protocol - wokring in Phase II   Consulted and Agree with Plan of Care Patient      Patient will  benefit from skilled therapeutic intervention in order to improve the following deficits and impairments:  Abnormal gait, Decreased activity tolerance, Decreased balance, Decreased range of motion, Decreased mobility, Decreased strength, Difficulty walking, Increased edema, Pain  Visit Diagnosis: Acute pain of left knee  Stiffness of left knee, not elsewhere classified  Difficulty in walking, not elsewhere classified  Other abnormalities of gait and mobility  Muscle weakness (generalized)     Problem List There are no active problems to display for this patient.    Eric Leon, PT, DPT 12/19/16 5:55 PM   [facility] [place] [address]  [address] [place], [place], [address] Phone: [phone]   Fax:  [phone]  Name: Eric Leon MRN: [id number] Date of Birth: 1990/05/23

## 2016-12-26 ENCOUNTER — Ambulatory Visit: Payer: BLUE CROSS/BLUE SHIELD | Admitting: Physical Therapy

## 2016-12-26 DIAGNOSIS — M25562 Pain in left knee: Secondary | ICD-10-CM

## 2016-12-26 DIAGNOSIS — R2689 Other abnormalities of gait and mobility: Secondary | ICD-10-CM

## 2016-12-26 DIAGNOSIS — R262 Difficulty in walking, not elsewhere classified: Secondary | ICD-10-CM

## 2016-12-26 DIAGNOSIS — M6281 Muscle weakness (generalized): Secondary | ICD-10-CM

## 2016-12-26 DIAGNOSIS — M25662 Stiffness of left knee, not elsewhere classified: Secondary | ICD-10-CM

## 2016-12-26 NOTE — Therapy (Addendum)
[facility] [address]  [place], [place], [address] Phone: [phone]   Fax:  [phone]  Physical Therapy Treatment  Patient Details  Name: Eric Leon MRN: [id number] Date of Birth: [birth year] Referring Provider: Dr. Victorino December  Encounter Date: 12/26/2016      PT End of Session - 12/26/16 1700    Visit Number 9   Date for PT Re-Evaluation 01/14/17   PT Start Time 6378   PT Stop Time 1737   PT Time Calculation (min) 39 min   Activity Tolerance Patient tolerated treatment well   Behavior During Therapy [facility] for tasks assessed/performed      No past medical history on file.  Past Surgical History:  Procedure Laterality Date  . CLAVICLE SURGERY      There were no vitals filed for this visit.      Subjective Assessment - 12/26/16 1659    Subjective Saw MD - able to start running - no cutting   Diagnostic tests MRI - tear of L medial meniscus, torn PCL   Patient Stated Goals improve function, return to sport   Currently in Pain? No/denies   Multiple Pain Sites No                         [facility] Adult PT Treatment/Exercise - 12/26/16 1706      Knee/Hip Exercises: Stretches   Other Knee/Hip Stretches plank - elbows and toes 3 x 1 min   Other Knee/Hip Stretches foam roller to quad - 3 way (toes in/down/out) x 1 min each     Knee/Hip Exercises: Aerobic   Elliptical L5 x 2 min   Isokinetic 3.0 x 1 min, 4.8 x 2 min, 3.4 x 1 min, 5.2 x 2 min, 3.4 x 1 min      Knee/Hip Exercises: Machines for Strengthening   Cybex Knee Flexion 25# B LE x 15; 25# B con/L ecc x 15     Knee/Hip Exercises: Plyometrics   Bilateral Jumping Limitations fwd/bwd 2 x 25; side to side 2 x 25; DL jumping to 6" step with L anterior step down x 15     Knee/Hip Exercises: Standing   Wall Squat 20 reps;5 seconds   Wall Squat Limitations with orange pball against wall   Other Standing Knee Exercises L SL squat to  table x 15 reps                  PT Short Term Goals - 09/20/16 0856      PT SHORT TERM GOAL #1   Title Patient to be independent with initial HEP for gentle stretching and strengthening (09/19/16)   Status Achieved           PT Long Term Goals - 12/03/16 1618      PT LONG TERM GOAL #1   Title Patient to be independent with advanced HEP   Target Date 01/14/17     PT LONG TERM GOAL #2   Title Patient to demonstrate proper gait mechanics with good heel toe gait pattern (11/1916)   Status Achieved     PT LONG TERM GOAL #3   Title Patient to improve L knee AROM to 0-120    Status On-going   Target Date 01/14/17     PT LONG TERM GOAL #4   Title Patient to improve L LE strength to >/= 4+/5   Status On-going   Target Date 01/14/17  PT LONG TERM GOAL #5   Title Patient to demonstrate ability to ambulate up/down 3 flights of stairs with reciprocal gait pattern without use of handrail and good quad control (11/29/16)   Status Achieved     Additional Long Term Goals   Additional Long Term Goals Yes     PT LONG TERM GOAL #6   Title patient to demonstrate good running/jumping/landing mechanics as MD and protocol allows   Status New   Target Date 01/14/17               Plan - 12/26/16 1700    Clinical Impression Statement Patient reporting seeing MD last week - able to progress into phase three with inclusion of running. Good tolerance today to interval treadmill running with no pain reported. DL jumping also incorporated today with no issue, however, does tend to weight shift away from L LE, likely due to learned non-use. Will continue to progress as patient tolerates and within protocol.    PT Treatment/Interventions ADLs/Self Care Home Management;Cryotherapy;Electrical Stimulation;Moist Heat;Ultrasound;Neuromuscular re-education;Balance training;Therapeutic exercise;Therapeutic activities;Functional mobility training;Stair training;Gait training;Patient/family  education;Manual techniques;Passive range of motion;Vasopneumatic Device;Taping;Dry needling;Iontophoresis 70m/ml Dexamethasone   PT Next Visit Plan progress per protocol - working in phase III   Consulted and Agree with Plan of Care Patient      Patient will benefit from skilled therapeutic intervention in order to improve the following deficits and impairments:  Abnormal gait, Decreased activity tolerance, Decreased balance, Decreased range of motion, Decreased mobility, Decreased strength, Difficulty walking, Increased edema, Pain  Visit Diagnosis: Acute pain of left knee  Stiffness of left knee, not elsewhere classified  Difficulty in walking, not elsewhere classified  Other abnormalities of gait and mobility  Muscle weakness (generalized)     Problem List There are no active problems to display for this patient.    SLanney Gins PT, DPT 12/26/16 5:37 PM  PHYSICAL THERAPY DISCHARGE SUMMARY  Visits from Start of Care: 9  Current functional level related to goals / functional outcomes: See above   Remaining deficits: See above; some reduced mechanics with running/jumping/landing   Education / Equipment: HEP  Plan: Patient agrees to discharge.  Patient goals were partially met. Patient is being discharged due to not returning since the last visit.  ?????    SLanney Gins PT, DPT 02/26/17 8:27 AM   [facility] [place] [address]Phone: [phone]  Fax:  [phone] Name: Eric LuptonMRN: [id number] of Birth: [birth year]

## 2016-12-28 ENCOUNTER — Ambulatory Visit: Payer: BLUE CROSS/BLUE SHIELD | Admitting: Physical Therapy

## 2018-07-30 IMAGING — MR MR KNEE*L* W/O CM
5 of 6 series · 32 of 40 positions shown · non-contrast
Comparison: Radiographs 08/14/2016

CLINICAL DATA: Injured knee playing softball 3 days ago.

EXAM:
MRI OF THE LEFT KNEE WITHOUT CONTRAST
TECHNIQUE: Multiplanar, multisequence MR imaging of the knee was performed. No
intravenous contrast was administered.

[Series 6: PD fat-sat · axial · left · 3.0mm · 0.39mm/px · z∈[-31,+97]mm · 9 of 40 slices shown (1 of 3)]
[im 1/40]
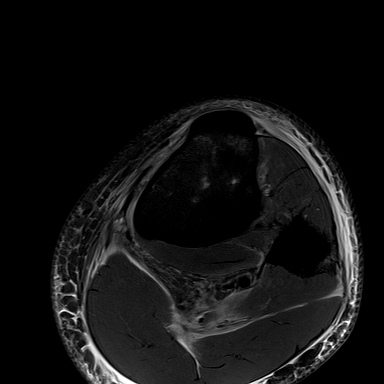
[im 5/40]
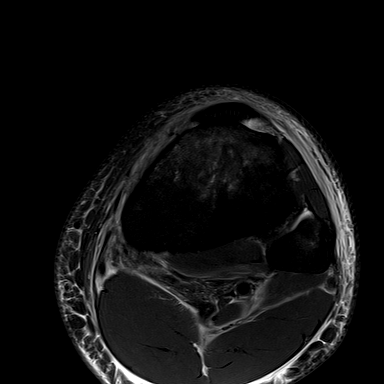
[im 10/40]
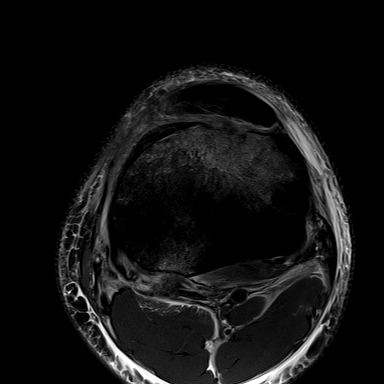
[im 15/40]
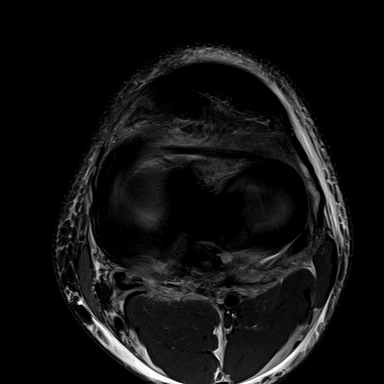
[im 20/40]
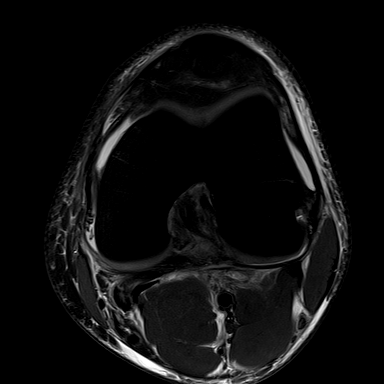
[im 25/40]
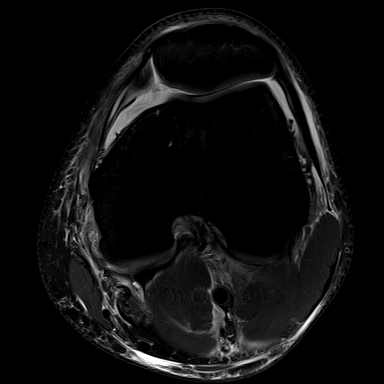
[im 30/40]
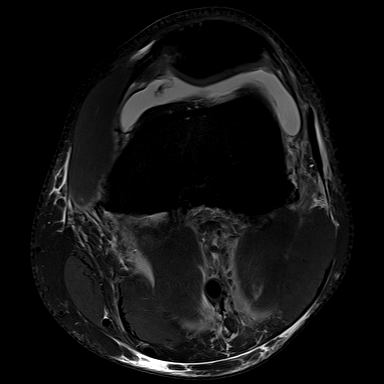
[im 35/40]
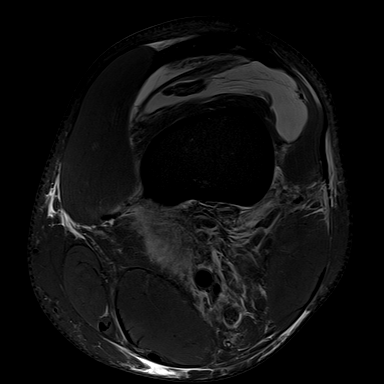
[im 40/40]
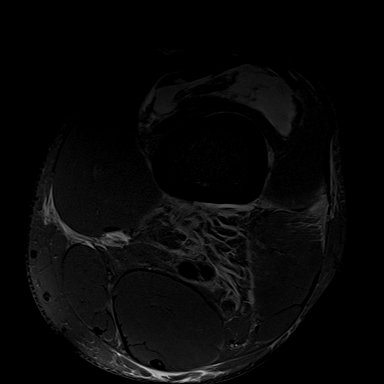

[Series 8: PD fat-sat · sagittal · left · 3.0mm · 0.39mm/px · 6 of 30 slices shown (2 of 3)]
[im 1/30]
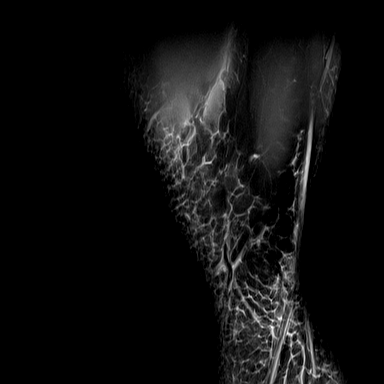
[im 6/30]
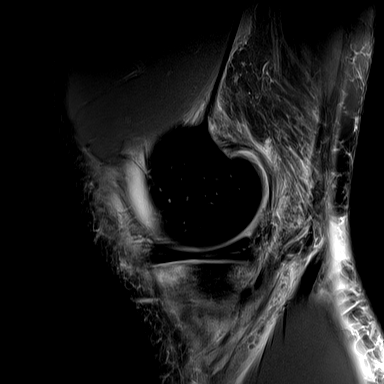
[im 12/30]
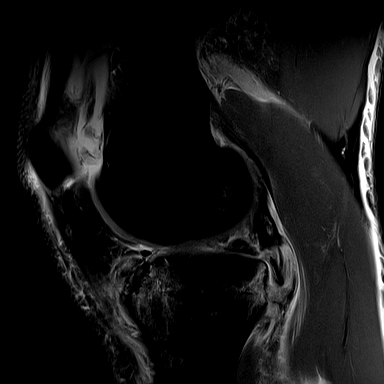
[im 18/30]
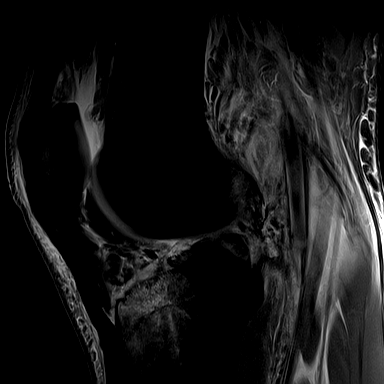
[im 24/30]
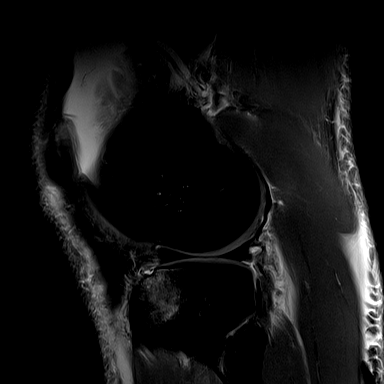
[im 30/30]
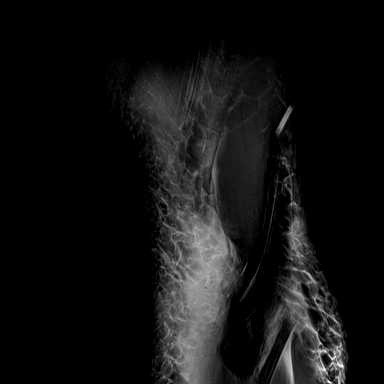

[Series 9: PD fat-sat · coronal · left · 3.0mm · 0.33mm/px · 7 of 33 slices shown (3 of 3)]
[im 1/33]
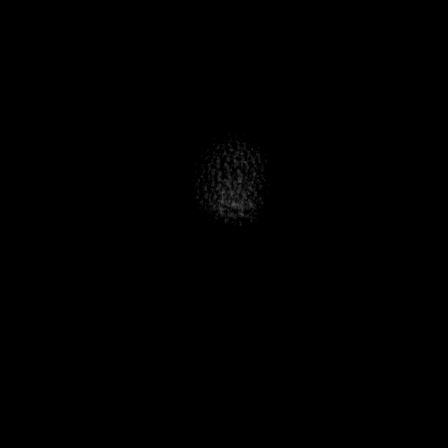
[im 6/33]
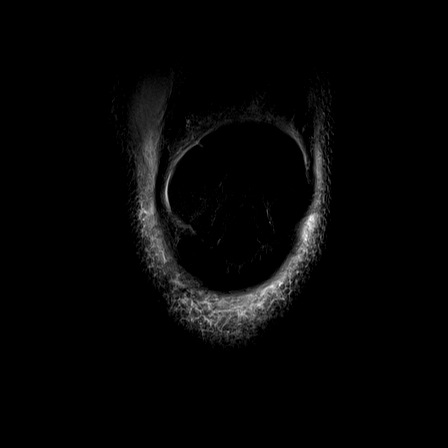
[im 11/33]
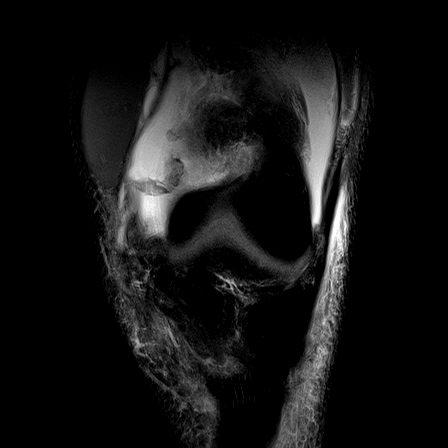
[im 17/33]
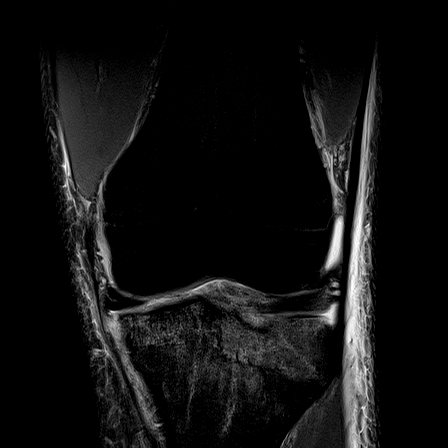
[im 22/33]
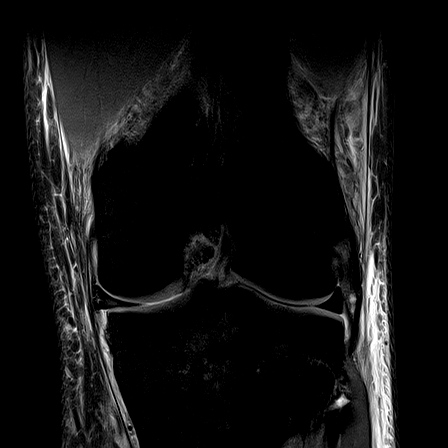
[im 27/33]
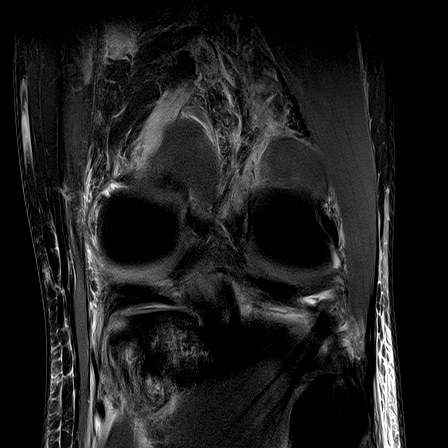
[im 33/33]
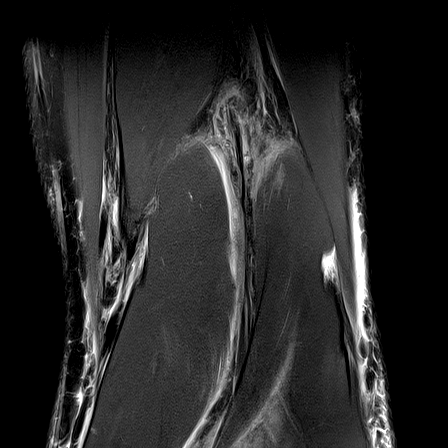

[Series 10: T2 fat-sat · coronal · left · 3.0mm · 0.39mm/px · 6 of 33 slices shown]
[im 1/33]
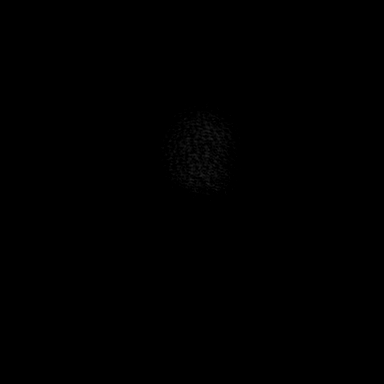
[im 6/33]
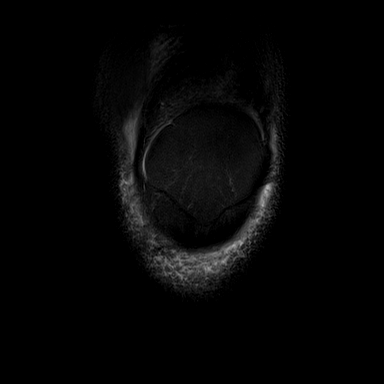
[im 11/33]
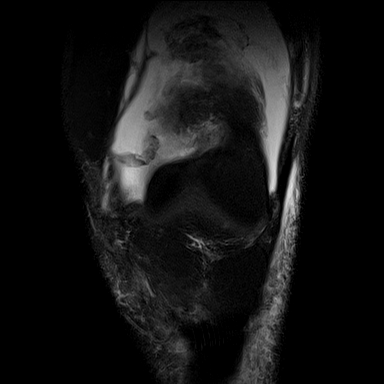
[im 17/33]
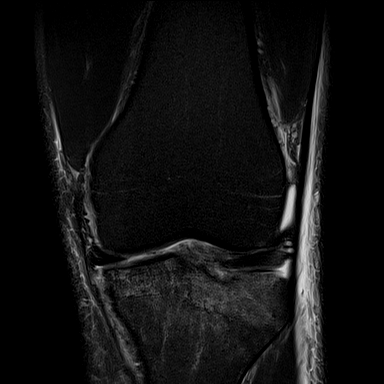
[im 22/33]
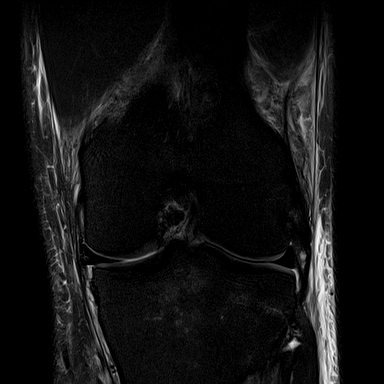
[im 27/33]
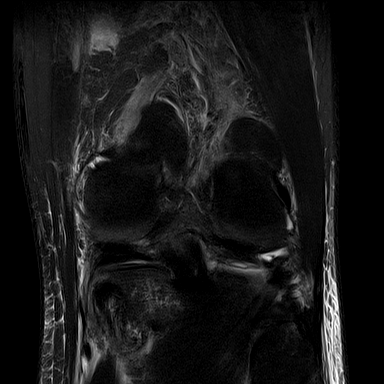

[Series 11: PD · oblique · left · 1.5mm · 0.44mm/px · 4 of 21 slices shown]
[im 1/21]
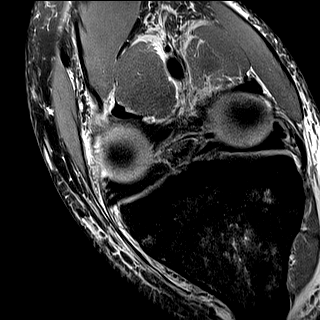
[im 7/21]
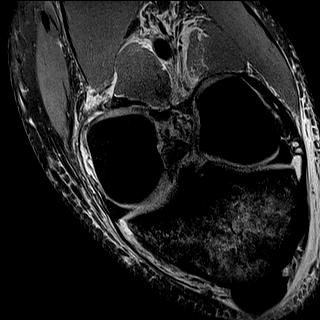
[im 14/21]
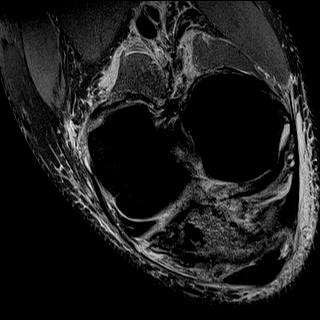
[im 21/21]
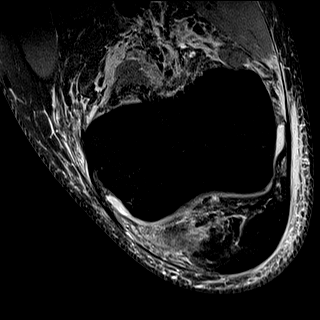

[32 of 40 positions shown; findings below may reference images not displayed]

FINDINGS: MENISCI

Medial meniscus: Partial thickness radial tear involving the
posterior horn at the meniscal root. There is partial detachment and
associated medial protrusion of the meniscus of up to 5 mm.

Lateral meniscus:  Intact

LIGAMENTS

Cruciates: The ACL is intact. The PCL is ruptured from its femoral
attachment site.

Collaterals: Intact. Mcl sprain and MCL and pes anserine traumatic
bursitis.

CARTILAGE

Patellofemoral:  Normal

Medial:  Normal

Lateral:  Normal

Joint:  Large joint effusion and hemarthrosis.

Popliteal Fossa:  No popliteal mass or Baker's cyst.  The

Extensor Mechanism: The patella retinacular structures are intact
and the quadriceps and patellar tendons are intact.

Bones: Multiple fractures involving the anterior aspect of the tibia
with areas of depression/ impaction and marked marrow edema. There
are also small fractures involving the posterior aspect of the tibia
and a probable small avulsion fracture at the PCL attachment site.

Other: The knee musculature is intact. There is significant fluid,
hemorrhage and edema in the popliteal region.
IMPRESSION: 1. The PCL rupture and significant anterior tibial bone contusion
and impaction fractures consistent with a hyperextension injury.
2. Mild MCL sprain.
3. Partial thickness radial tear involving the posterior horn of the
medial meniscus at the meniscal root with partial detachment and
medial protrusion of the meniscus up to 5 mm.
4. Large joint effusion/hemarthrosis.
5. Intact articular cartilage.

## 2023-04-25 ENCOUNTER — Other Ambulatory Visit (INDEPENDENT_AMBULATORY_CARE_PROVIDER_SITE_OTHER): Payer: Self-pay

## 2023-04-25 ENCOUNTER — Ambulatory Visit (INDEPENDENT_AMBULATORY_CARE_PROVIDER_SITE_OTHER): Payer: Managed Care, Other (non HMO) | Admitting: Physician Assistant

## 2023-04-25 ENCOUNTER — Encounter: Payer: Self-pay | Admitting: Physician Assistant

## 2023-04-25 DIAGNOSIS — M79645 Pain in left finger(s): Secondary | ICD-10-CM | POA: Insufficient documentation

## 2023-04-25 DIAGNOSIS — M65312 Trigger thumb, left thumb: Secondary | ICD-10-CM | POA: Diagnosis not present

## 2023-04-25 DIAGNOSIS — M25572 Pain in left ankle and joints of left foot: Secondary | ICD-10-CM | POA: Diagnosis not present
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
## 2023-05-14 ENCOUNTER — Ambulatory Visit: Payer: Managed Care, Other (non HMO) | Admitting: Physician Assistant

## 2023-05-20 ENCOUNTER — Ambulatory Visit: Payer: Managed Care, Other (non HMO) | Admitting: Physician Assistant

## 2023-05-21 ENCOUNTER — Ambulatory Visit (INDEPENDENT_AMBULATORY_CARE_PROVIDER_SITE_OTHER): Payer: Managed Care, Other (non HMO) | Admitting: Physician Assistant

## 2023-05-21 ENCOUNTER — Encounter: Payer: Self-pay | Admitting: Physician Assistant

## 2023-05-21 DIAGNOSIS — M79645 Pain in left finger(s): Secondary | ICD-10-CM
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
